# Patient Record
Sex: Male | Born: 1975 | Race: Asian | Hispanic: No | Marital: Single | Smoking: Current every day smoker
Health system: Southern US, Community
[De-identification: ages and names within clinical notes are randomized; demographics above are authoritative.]

## PROBLEM LIST (undated history)

## (undated) DIAGNOSIS — K5909 Other constipation: Secondary | ICD-10-CM

## (undated) DIAGNOSIS — Z8614 Personal history of Methicillin resistant Staphylococcus aureus infection: Secondary | ICD-10-CM

## (undated) DIAGNOSIS — M5412 Radiculopathy, cervical region: Secondary | ICD-10-CM

## (undated) DIAGNOSIS — K219 Gastro-esophageal reflux disease without esophagitis: Secondary | ICD-10-CM

## (undated) DIAGNOSIS — R569 Unspecified convulsions: Secondary | ICD-10-CM

## (undated) DIAGNOSIS — Z8619 Personal history of other infectious and parasitic diseases: Secondary | ICD-10-CM

## (undated) DIAGNOSIS — Z8719 Personal history of other diseases of the digestive system: Secondary | ICD-10-CM

## (undated) DIAGNOSIS — I10 Essential (primary) hypertension: Secondary | ICD-10-CM

## (undated) DIAGNOSIS — Z87898 Personal history of other specified conditions: Secondary | ICD-10-CM

## (undated) DIAGNOSIS — K611 Rectal abscess: Secondary | ICD-10-CM

## (undated) DIAGNOSIS — I4510 Unspecified right bundle-branch block: Secondary | ICD-10-CM

## (undated) DIAGNOSIS — K629 Disease of anus and rectum, unspecified: Secondary | ICD-10-CM

## (undated) HISTORY — PX: COLONOSCOPY WITH PROPOFOL: SHX5780

## (undated) HISTORY — PX: COLONOSCOPY: SHX174

---
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
## 2006-11-14 ENCOUNTER — Emergency Department (HOSPITAL_COMMUNITY): Admission: EM | Admit: 2006-11-14 | Discharge: 2006-11-14 | Payer: Self-pay | Admitting: Emergency Medicine

## 2007-04-13 DIAGNOSIS — Z87828 Personal history of other (healed) physical injury and trauma: Secondary | ICD-10-CM

## 2007-04-13 HISTORY — DX: Personal history of other (healed) physical injury and trauma: Z87.828

## 2007-04-29 ENCOUNTER — Inpatient Hospital Stay (HOSPITAL_COMMUNITY): Admission: AC | Admit: 2007-04-29 | Discharge: 2007-04-30 | Payer: Self-pay
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
## 2010-11-06 LAB — URINALYSIS, ROUTINE W REFLEX MICROSCOPIC
Bilirubin Urine: NEGATIVE
Glucose, UA: 100 — AB
Hgb urine dipstick: NEGATIVE
Ketones, ur: NEGATIVE
Protein, ur: >300 — AB

## 2010-11-06 LAB — I-STAT 8, (EC8 V) (CONVERTED LAB)
Acid-base deficit: 2
HCT: 52
Hemoglobin: 17.7 — ABNORMAL HIGH
Potassium: 4.2
Sodium: 137
TCO2: 25

## 2010-11-06 LAB — CBC
HCT: 45.3
MCV: 84.3
Platelets: 409 — ABNORMAL HIGH
WBC: 16.6 — ABNORMAL HIGH

## 2010-11-06 LAB — TYPE AND SCREEN: Antibody Screen: NEGATIVE

## 2010-11-06 LAB — RAPID URINE DRUG SCREEN, HOSP PERFORMED
Barbiturates: NOT DETECTED
Benzodiazepines: NOT DETECTED
Cocaine: NOT DETECTED

## 2010-11-06 LAB — URINE MICROSCOPIC-ADD ON

## 2010-11-06 LAB — ABO/RH: ABO/RH(D): A POS

## 2014-02-12 DIAGNOSIS — Z8614 Personal history of Methicillin resistant Staphylococcus aureus infection: Secondary | ICD-10-CM

## 2014-02-12 HISTORY — DX: Personal history of Methicillin resistant Staphylococcus aureus infection: Z86.14

## 2015-06-11 ENCOUNTER — Encounter (HOSPITAL_COMMUNITY): Payer: Self-pay | Admitting: Emergency Medicine

## 2015-06-11 ENCOUNTER — Emergency Department (HOSPITAL_COMMUNITY)
Admission: EM | Admit: 2015-06-11 | Discharge: 2015-06-11 | Disposition: A | Discharge status: Home / Self Care | Payer: Self-pay | Attending: Emergency Medicine | Admitting: Emergency Medicine

## 2015-06-11 ENCOUNTER — Emergency Department (HOSPITAL_COMMUNITY): Payer: Self-pay

## 2015-06-11 DIAGNOSIS — F17200 Nicotine dependence, unspecified, uncomplicated: Secondary | ICD-10-CM | POA: Insufficient documentation

## 2015-06-11 DIAGNOSIS — Y998 Other external cause status: Secondary | ICD-10-CM | POA: Insufficient documentation

## 2015-06-11 DIAGNOSIS — Z880 Allergy status to penicillin: Secondary | ICD-10-CM | POA: Insufficient documentation

## 2015-06-11 DIAGNOSIS — R569 Unspecified convulsions: Secondary | ICD-10-CM | POA: Insufficient documentation

## 2015-06-11 DIAGNOSIS — F1210 Cannabis abuse, uncomplicated: Secondary | ICD-10-CM | POA: Insufficient documentation

## 2015-06-11 DIAGNOSIS — I10 Essential (primary) hypertension: Secondary | ICD-10-CM | POA: Insufficient documentation

## 2015-06-11 DIAGNOSIS — Y92410 Unspecified street and highway as the place of occurrence of the external cause: Secondary | ICD-10-CM | POA: Insufficient documentation

## 2015-06-11 DIAGNOSIS — Y9389 Activity, other specified: Secondary | ICD-10-CM | POA: Insufficient documentation

## 2015-06-11 DIAGNOSIS — R42 Dizziness and giddiness: Secondary | ICD-10-CM | POA: Insufficient documentation

## 2015-06-11 DIAGNOSIS — F1310 Sedative, hypnotic or anxiolytic abuse, uncomplicated: Secondary | ICD-10-CM | POA: Insufficient documentation

## 2015-06-11 DIAGNOSIS — S60511A Abrasion of right hand, initial encounter: Secondary | ICD-10-CM | POA: Insufficient documentation

## 2015-06-11 DIAGNOSIS — S60512A Abrasion of left hand, initial encounter: Secondary | ICD-10-CM | POA: Insufficient documentation

## 2015-06-11 HISTORY — DX: Essential (primary) hypertension: I10

## 2015-06-11 HISTORY — DX: Unspecified convulsions: R56.9

## 2015-06-11 LAB — CBG MONITORING, ED: GLUCOSE-CAPILLARY: 122 mg/dL — AB (ref 65–99)

## 2015-06-11 LAB — CBC
HEMATOCRIT: 46.9 % (ref 39.0–52.0)
HEMOGLOBIN: 15.5 g/dL (ref 13.0–17.0)
MCH: 27.7 pg (ref 26.0–34.0)
MCHC: 33 g/dL (ref 30.0–36.0)
MCV: 83.9 fL (ref 78.0–100.0)
PLATELETS: 411 10*3/uL — AB (ref 150–400)
RBC: 5.59 MIL/uL (ref 4.22–5.81)
RDW: 15.3 % (ref 11.5–15.5)
WBC: 15.2 10*3/uL — AB (ref 4.0–10.5)

## 2015-06-11 LAB — RAPID URINE DRUG SCREEN, HOSP PERFORMED
AMPHETAMINES: POSITIVE — AB
BARBITURATES: NOT DETECTED
BENZODIAZEPINES: POSITIVE — AB
Cocaine: NOT DETECTED
Opiates: NOT DETECTED
TETRAHYDROCANNABINOL: POSITIVE — AB

## 2015-06-11 LAB — BASIC METABOLIC PANEL
ANION GAP: 12 (ref 5–15)
BUN: 13 mg/dL (ref 6–20)
CHLORIDE: 107 mmol/L (ref 101–111)
CO2: 20 mmol/L — AB (ref 22–32)
Calcium: 9.6 mg/dL (ref 8.9–10.3)
Creatinine, Ser: 0.88 mg/dL (ref 0.61–1.24)
GFR calc Af Amer: >60 mL/min (ref >60)
GLUCOSE: 112 mg/dL — AB (ref 65–99)
POTASSIUM: 4 mmol/L (ref 3.5–5.1)
Sodium: 139 mmol/L (ref 135–145)

## 2015-06-11 LAB — CK: CK TOTAL: 133 U/L (ref 49–397)

## 2015-06-11 MED ORDER — SODIUM CHLORIDE 0.9 % IV BOLUS (SEPSIS)
1000.0000 mL | Freq: Once | INTRAVENOUS | Status: AC
  Administered 2015-06-11: 1000 mL via INTRAVENOUS

## 2015-06-11 MED ORDER — LEVETIRACETAM 500 MG PO TABS: 500.0000 mg | ORAL_TABLET | Freq: Two times a day (BID) | ORAL | Status: DC
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
## 2015-06-11 NOTE — Discharge Instructions (Signed)
I recommend starting Keppra twice daily for seizures.  You will receive a phone call to establish your outpatient Neurology follow up.  Do not drive or operate heavy machinery until you have been cleared by neurology.  Seizure, Adult A seizure is abnormal electrical activity in the brain. Seizures usually last from 30 seconds to 2 minutes. There are various types of seizures. Before a seizure, you may have a warning sensation (aura) that a seizure is about to occur. An aura may include the following symptoms:   Fear or anxiety.  Nausea.  Feeling like the room is spinning (vertigo).  Vision changes, such as seeing flashing lights or spots. Common symptoms during a seizure include:  A change in attention or behavior (altered mental status).  Convulsions with rhythmic jerking movements.  Drooling.  Rapid eye movements.  Grunting.  Loss of bladder and bowel control.  Bitter taste in the mouth.  Tongue biting. After a seizure, you may feel confused and sleepy. You may also have an injury resulting from convulsions during the seizure. HOME CARE INSTRUCTIONS   If you are given medicines, take them exactly as prescribed by your health care provider.  Keep all follow-up appointments as directed by your health care provider.  Do not swim or drive or engage in risky activity during which a seizure could cause further injury to you or others until your health care provider says it is OK.  Get adequate rest.  Teach friends and family what to do if you have a seizure. They should:  Lay you on the ground to prevent a fall.  Put a cushion under your head.  Loosen any tight clothing around your neck.  Turn you on your side. If vomiting occurs, this helps keep your airway clear.  Stay with you until you recover.  Know whether or not you need emergency care. SEEK IMMEDIATE MEDICAL CARE IF:  The seizure lasts longer than 5 minutes.  The seizure is severe or you do not wake up  immediately after the seizure.  You have an altered mental status after the seizure.  You are having more frequent or worsening seizures. Someone should drive you to the emergency department or call local emergency services (911 in [place] MAKE SURE YOU:  Understand these instructions.  Will watch your condition.  Will get help right away if you are not doing well or get worse.   This information is not intended to replace advice given to you by your health care provider. Make sure you discuss any questions you have with your health care provider.   Document Released: 01/27/2000 Document Revised: 02/19/2014 Document Reviewed: 09/10/2012 Elsevier Interactive Patient Education [employer]2016 [employer] Inc[employer]
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
## 2016-07-13 DIAGNOSIS — Z87448 Personal history of other diseases of urinary system: Secondary | ICD-10-CM

## 2016-07-13 HISTORY — DX: Personal history of other diseases of urinary system: Z87.448

## 2016-07-30 ENCOUNTER — Emergency Department (HOSPITAL_COMMUNITY): Payer: Medicaid Other

## 2016-07-30 ENCOUNTER — Encounter (HOSPITAL_COMMUNITY): Payer: Self-pay | Admitting: Emergency Medicine

## 2016-07-30 ENCOUNTER — Inpatient Hospital Stay (HOSPITAL_COMMUNITY)
Admission: EM | Admit: 2016-07-30 | Discharge: 2016-08-04 | DRG: 689 | Disposition: A | Discharge status: Home / Self Care | Payer: Medicaid Other | Attending: Internal Medicine | Admitting: Internal Medicine

## 2016-07-30 DIAGNOSIS — Z8249 Family history of ischemic heart disease and other diseases of the circulatory system: Secondary | ICD-10-CM | POA: Diagnosis not present

## 2016-07-30 DIAGNOSIS — Z809 Family history of malignant neoplasm, unspecified: Secondary | ICD-10-CM

## 2016-07-30 DIAGNOSIS — K6819 Other retroperitoneal abscess: Secondary | ICD-10-CM | POA: Diagnosis present

## 2016-07-30 DIAGNOSIS — G40909 Epilepsy, unspecified, not intractable, without status epilepticus: Secondary | ICD-10-CM | POA: Diagnosis present

## 2016-07-30 DIAGNOSIS — Z880 Allergy status to penicillin: Secondary | ICD-10-CM

## 2016-07-30 DIAGNOSIS — Z825 Family history of asthma and other chronic lower respiratory diseases: Secondary | ICD-10-CM | POA: Diagnosis not present

## 2016-07-30 DIAGNOSIS — K5909 Other constipation: Secondary | ICD-10-CM | POA: Diagnosis present

## 2016-07-30 DIAGNOSIS — N179 Acute kidney failure, unspecified: Secondary | ICD-10-CM | POA: Diagnosis present

## 2016-07-30 DIAGNOSIS — R059 Cough, unspecified: Secondary | ICD-10-CM

## 2016-07-30 DIAGNOSIS — R109 Unspecified abdominal pain: Secondary | ICD-10-CM

## 2016-07-30 DIAGNOSIS — R310 Gross hematuria: Secondary | ICD-10-CM

## 2016-07-30 DIAGNOSIS — R569 Unspecified convulsions: Secondary | ICD-10-CM

## 2016-07-30 DIAGNOSIS — I10 Essential (primary) hypertension: Secondary | ICD-10-CM | POA: Diagnosis present

## 2016-07-30 DIAGNOSIS — T39395A Adverse effect of other nonsteroidal anti-inflammatory drugs [NSAID], initial encounter: Secondary | ICD-10-CM | POA: Diagnosis present

## 2016-07-30 DIAGNOSIS — R1031 Right lower quadrant pain: Secondary | ICD-10-CM | POA: Diagnosis not present

## 2016-07-30 DIAGNOSIS — N10 Acute tubulo-interstitial nephritis: Principal | ICD-10-CM | POA: Diagnosis present

## 2016-07-30 DIAGNOSIS — R319 Hematuria, unspecified: Secondary | ICD-10-CM

## 2016-07-30 DIAGNOSIS — K5900 Constipation, unspecified: Secondary | ICD-10-CM

## 2016-07-30 DIAGNOSIS — R05 Cough: Secondary | ICD-10-CM

## 2016-07-30 LAB — COMPREHENSIVE METABOLIC PANEL
ALK PHOS: 63 U/L (ref 38–126)
ALT: 27 U/L (ref 17–63)
ANION GAP: 13 (ref 5–15)
AST: 20 U/L (ref 15–41)
Albumin: 5 g/dL (ref 3.5–5.0)
BUN: 13 mg/dL (ref 6–20)
CALCIUM: 9.8 mg/dL (ref 8.9–10.3)
CHLORIDE: 101 mmol/L (ref 101–111)
CO2: 22 mmol/L (ref 22–32)
Creatinine, Ser: 0.94 mg/dL (ref 0.61–1.24)
GFR calc non Af Amer: >60 mL/min (ref >60)
Glucose, Bld: 99 mg/dL (ref 65–99)
Potassium: 3.9 mmol/L (ref 3.5–5.1)
SODIUM: 136 mmol/L (ref 135–145)
Total Bilirubin: 1.1 mg/dL (ref 0.3–1.2)
Total Protein: 8.4 g/dL — ABNORMAL HIGH (ref 6.5–8.1)

## 2016-07-30 LAB — CBC WITH DIFFERENTIAL/PLATELET
Basophils Absolute: 0 10*3/uL (ref 0.0–0.1)
Basophils Relative: 0 %
EOS PCT: 1 %
Eosinophils Absolute: 0.2 10*3/uL (ref 0.0–0.7)
HCT: 44.5 % (ref 39.0–52.0)
Hemoglobin: 15.6 g/dL (ref 13.0–17.0)
Lymphocytes Relative: 15 %
Lymphs Abs: 2.8 10*3/uL (ref 0.7–4.0)
MCH: 27.7 pg (ref 26.0–34.0)
MCHC: 35.1 g/dL (ref 30.0–36.0)
MCV: 79 fL (ref 78.0–100.0)
Monocytes Absolute: 2 10*3/uL — ABNORMAL HIGH (ref 0.1–1.0)
Monocytes Relative: 11 %
NEUTROS PCT: 73 %
Neutro Abs: 13.6 10*3/uL — ABNORMAL HIGH (ref 1.7–7.7)
Platelets: 402 10*3/uL — ABNORMAL HIGH (ref 150–400)
RBC: 5.63 MIL/uL (ref 4.22–5.81)
RDW: 15.1 % (ref 11.5–15.5)
WBC: 18.6 10*3/uL — AB (ref 4.0–10.5)

## 2016-07-30 LAB — URINALYSIS, ROUTINE W REFLEX MICROSCOPIC
Squamous Epithelial / LPF: NONE SEEN
WBC UA: NONE SEEN WBC/hpf (ref 0–5)

## 2016-07-30 LAB — LIPASE, BLOOD: Lipase: 37 U/L (ref 11–51)

## 2016-07-30 LAB — I-STAT CG4 LACTIC ACID, ED: LACTIC ACID, VENOUS: 0.97 mmol/L (ref 0.5–1.9)

## 2016-07-30 MED ORDER — LEVOFLOXACIN IN D5W 750 MG/150ML IV SOLN
750.0000 mg | Freq: Once | INTRAVENOUS | Status: DC
  Administered 2016-07-30: 750 mg via INTRAVENOUS
  Filled 2016-07-30: qty 150

## 2016-07-30 MED ORDER — ONDANSETRON HCL 4 MG PO TABS: 4.0000 mg | ORAL_TABLET | Freq: Four times a day (QID) | ORAL | Status: DC | PRN

## 2016-07-30 MED ORDER — SODIUM CHLORIDE 0.9 % IV SOLN
INTRAVENOUS | Status: DC
  Administered 2016-07-30 – 2016-07-31 (×3): via INTRAVENOUS
  Administered 2016-08-01: 100 mL/h via INTRAVENOUS
  Administered 2016-08-01 – 2016-08-02 (×2): via INTRAVENOUS
  Administered 2016-08-02: 1000 mL via INTRAVENOUS
  Administered 2016-08-02: 100 mL/h via INTRAVENOUS

## 2016-07-30 MED ORDER — DEXTROSE 5 % IV SOLN
1.0000 g | Freq: Once | INTRAVENOUS | Status: AC
  Administered 2016-07-30: 1 g via INTRAVENOUS
  Filled 2016-07-30: qty 10

## 2016-07-30 MED ORDER — LEVOFLOXACIN IN D5W 750 MG/150ML IV SOLN: 750.0000 mg | Freq: Once | INTRAVENOUS | Status: DC

## 2016-07-30 MED ORDER — ONDANSETRON HCL 4 MG/2ML IJ SOLN: 4.0000 mg | Freq: Four times a day (QID) | INTRAMUSCULAR | Status: DC | PRN

## 2016-07-30 MED ORDER — ACETAMINOPHEN 325 MG PO TABS
650.0000 mg | ORAL_TABLET | Freq: Four times a day (QID) | ORAL | Status: DC | PRN
Start: 2016-07-30 — End: 2016-08-04
  Administered 2016-07-31 – 2016-08-03 (×6): 650 mg via ORAL
  Filled 2016-07-30 (×6): qty 2

## 2016-07-30 MED ORDER — MORPHINE SULFATE (PF) 4 MG/ML IV SOLN
4.0000 mg | Freq: Once | INTRAVENOUS | Status: AC
  Administered 2016-07-30: 4 mg via INTRAVENOUS
  Filled 2016-07-30: qty 1

## 2016-07-30 MED ORDER — SODIUM CHLORIDE 0.9 % IV BOLUS (SEPSIS)
1000.0000 mL | Freq: Once | INTRAVENOUS | Status: AC
  Administered 2016-07-30: 1000 mL via INTRAVENOUS

## 2016-07-30 MED ORDER — DEXTROSE 5 % IV SOLN: 2.0000 g | Freq: Once | INTRAVENOUS | Status: DC

## 2016-07-30 MED ORDER — MORPHINE SULFATE (PF) 2 MG/ML IV SOLN
2.0000 mg | Freq: Once | INTRAVENOUS | Status: AC
  Administered 2016-07-30: 2 mg via INTRAVENOUS
  Filled 2016-07-30: qty 1

## 2016-07-30 MED ORDER — CEFEPIME HCL 1 G IJ SOLR
1.0000 g | Freq: Two times a day (BID) | INTRAMUSCULAR | Status: DC
  Administered 2016-07-30 – 2016-08-03 (×9): 1 g via INTRAVENOUS
  Filled 2016-07-30 (×12): qty 1

## 2016-07-30 MED ORDER — IOPAMIDOL (ISOVUE-300) INJECTION 61%
INTRAVENOUS | Status: AC
  Filled 2016-07-30: qty 100

## 2016-07-30 MED ORDER — BISACODYL 5 MG PO TBEC
5.0000 mg | DELAYED_RELEASE_TABLET | Freq: Two times a day (BID) | ORAL | Status: DC | PRN
  Administered 2016-07-30 – 2016-07-31 (×2): 5 mg via ORAL
  Filled 2016-07-30 (×2): qty 1

## 2016-07-30 MED ORDER — IOPAMIDOL (ISOVUE-300) INJECTION 61%
100.0000 mL | Freq: Once | INTRAVENOUS | Status: AC | PRN
  Administered 2016-07-30: 100 mL via INTRAVENOUS

## 2016-07-30 MED ORDER — IBUPROFEN 200 MG PO TABS
400.0000 mg | ORAL_TABLET | Freq: Four times a day (QID) | ORAL | Status: DC | PRN
  Administered 2016-07-30: 400 mg via ORAL
  Filled 2016-07-30: qty 2

## 2016-07-30 MED ORDER — ACETAMINOPHEN 650 MG RE SUPP: 650.0000 mg | Freq: Four times a day (QID) | RECTAL | Status: DC | PRN

## 2016-07-30 NOTE — ED Notes (Signed)
Unable to collect labs at this time MD is in the room talking with patient 

## 2016-07-30 NOTE — ED Triage Notes (Signed)
Pt from home with complaints of right sided flank pain, hematuria, and lower abdominal pain with bloating. Pt states the bloating and abdominal pain have been ongoing x 2 days had he has been taking magnesium at home and dulcolax for constipation at baseline and this has not been helping with his bloating. Pt states that yesterday he began having blood in his urine so [facility] where [facility] stated "their machines were unable to detect bacteria due to the amount of blood in the urine" Pt states his symptoms and pain have gotten worse so he has come here

## 2016-07-30 NOTE — Progress Notes (Signed)
Pharmacy Antibiotic Note  Brian RocheMohammed Gilbert is a 41 y.o. male admitted on 07/30/2016 with UTI.  Complaints of flank pain, hematuria and lower abdominal pain.  Patient received Ceftriaxone 1gm IV x 1 in ED @ 20:45.  Aztreonam 2gm and Levofloxacin 750mg  IV x 1 dose each ordered in ED.  Pharmacy has been consulted for Levofloxacin and Aztreonam dosing.  CT Abdomen/Pelvis concerning for infectious pyelitis; suspicious for extensive infectious phlegmon secondary to bilateral ureteritis.    Plan:  Levofloxacin 750mg  IV q24h  Aztreonam 1gm IV q8h  Follow cultures  Height: 5\' 11"  (180.3 cm) Weight: 240 lb (108.9 kg) IBW/kg (Calculated) : 75.3  Temp (24hrs), Avg:98.5 F (36.9 C), Min:98.3 F (36.8 C), Max:98.7 F (37.1 C)   Recent Labs Lab 07/30/16 1703 07/30/16 2034  WBC 18.6*  --   CREATININE 0.94  --   LATICACIDVEN  --  0.97    Estimated Creatinine Clearance: 133.7 mL/min (by C-G formula based on SCr of 0.94 mg/dL).    Allergies  Allergen Reactions  . Penicillins Anaphylaxis    Has patient had a PCN reaction causing immediate rash, facial/tongue/throat swelling, SOB or lightheadedness with hypotension: yes Has patient had a PCN reaction causing severe rash involving mucus membranes or skin necrosis: no Has patient had a PCN reaction that required hospitalization: no Has patient had a PCN reaction occurring within the last 10 years? no If all of the above answers are "NO", then may proceed with Cephalosporin use.    Antimicrobials this admission: 6/18 Ceftriaxone x 1  6/18 Aztreonam >>   6/18 Levofloxacin >>  Dose adjustments this admission:    Microbiology results: 6/18 BCx: sent 6/18 UCx: sent   Thank you for allowing pharmacy to be a part of this patient's care.  Maryellen PilePoindexter, [place]arien Kading Trefz, PharmD 07/30/2016 10:02 PM

## 2016-07-30 NOTE — ED Provider Notes (Signed)
Emergency Department Provider Note   I have reviewed the triage vital signs and the nursing notes.   HISTORY  Chief Complaint Abdominal Pain and Flank Pain (right)   HPI Brian Gilbert is a 41 y.o. male with PMH of HTN and seizure disorder presents to the emergency department for evaluation of hematuria with worsening lower and slightly right-sided abdominal pain. Symptoms began over the past 2-3 days and gradually worsened over that time. No radiation of pain. No similar pain in the past. He has been treating as if pain is related to constipation at home with stool softener and magnesium. He's had small number of bowel movements but reports an associated bloating and decreased appetite because of fullness. He went to urgent care when his urine began to be much darker. They were unable to interpret the results because of the color of the urine. He denies any fevers or chills. No chest pain. Pain made worse with deep breathing but localized in the lower abdomen.   Past Medical History:  Diagnosis Date  . Hypertension   . Seizures [facility])     Patient Active Problem List   Diagnosis Date Noted  . Acute pyelonephritis 07/30/2016  . Seizures (HCC) 07/30/2016  . Essential hypertension 07/30/2016    History reviewed. No pertinent surgical history.    Allergies Penicillins  Family History  Problem Relation Age of Onset  . COPD Maternal Grandmother   . Hypertension Maternal Grandmother     Social History Social History  Substance Use Topics  . Smoking status: Current Every Day Smoker  . Smokeless tobacco: Never Used  . Alcohol use No    Review of Systems  Constitutional: No fever/chills Eyes: No visual changes. ENT: No sore throat. Cardiovascular: Denies chest pain. Respiratory: Denies shortness of breath. Gastrointestinal: Positive RLQ abdominal pain.  No nausea, no vomiting.  No diarrhea.  No constipation. Genitourinary: Negative for dysuria. Positive dark urine.   Musculoskeletal: Negative for back pain. Skin: Negative for rash. Neurological: Negative for headaches, focal weakness or numbness.  10-point ROS otherwise negative.  ____________________________________________   PHYSICAL EXAM:  VITAL SIGNS: ED Triage Vitals  Enc Vitals Group     BP 07/30/16 1543 (!) 164/94     Pulse Rate 07/30/16 1543 75     Resp 07/30/16 1543 20     Temp 07/30/16 1543 98.3 F (36.8 C)     Temp Source 07/30/16 1543 Oral     SpO2 07/30/16 1543 97 %     Weight 07/30/16 1544 240 lb (108.9 kg)     Height 07/30/16 1544 5\' 11"  (1.803 m)     Pain Score 07/30/16 1543 10   Constitutional: Alert and oriented. Slightly diaphoretic and uncomfortable appearing but sitting in bedside chair.  Eyes: Conjunctivae are normal. Head: Atraumatic. Nose: No congestion/rhinnorhea. Mouth/Throat: Mucous membranes are moist.  Oropharynx non-erythematous. Neck: No stridor.  Cardiovascular: Normal rate, regular rhythm. Good peripheral circulation. Grossly normal heart sounds.   Respiratory: Normal respiratory effort.  No retractions. Lungs CTAB. Gastrointestinal: Soft with positive lower abdominal tenderness. No rebound or guarding.  No distention.  Musculoskeletal: No lower extremity tenderness nor edema. No gross deformities of extremities. Neurologic:  Normal speech and language. No gross focal neurologic deficits are appreciated.  Skin:  Skin is warm, dry and intact. No rash noted.  ____________________________________________   LABS (all labs ordered are listed, but only abnormal results are displayed)  Labs Reviewed  URINALYSIS, ROUTINE W REFLEX MICROSCOPIC - Abnormal; Notable for the following:  Result Value   Color, Urine RED (*)    APPearance TURBID (*)    Glucose, UA   (*)    Value: TEST NOT REPORTED DUE TO COLOR INTERFERENCE OF URINE PIGMENT   Hgb urine dipstick   (*)    Value: TEST NOT REPORTED DUE TO COLOR INTERFERENCE OF URINE PIGMENT   Bilirubin Urine    (*)    Value: TEST NOT REPORTED DUE TO COLOR INTERFERENCE OF URINE PIGMENT   Ketones, ur   (*)    Value: TEST NOT REPORTED DUE TO COLOR INTERFERENCE OF URINE PIGMENT   Protein, ur   (*)    Value: TEST NOT REPORTED DUE TO COLOR INTERFERENCE OF URINE PIGMENT   Nitrite   (*)    Value: TEST NOT REPORTED DUE TO COLOR INTERFERENCE OF URINE PIGMENT   Leukocytes, UA   (*)    Value: TEST NOT REPORTED DUE TO COLOR INTERFERENCE OF URINE PIGMENT   Bacteria, UA MANY (*)    All other components within normal limits  COMPREHENSIVE METABOLIC PANEL - Abnormal; Notable for the following:    Total Protein 8.4 (*)    All other components within normal limits  CBC WITH DIFFERENTIAL/PLATELET - Abnormal; Notable for the following:    WBC 18.6 (*)    Platelets 402 (*)    Neutro Abs 13.6 (*)    Monocytes Absolute 2.0 (*)    All other components within normal limits  CBC - Abnormal; Notable for the following:    WBC 13.9 (*)    HCT 38.4 (*)    All other components within normal limits  BASIC METABOLIC PANEL - Abnormal; Notable for the following:    Glucose, Bld 103 (*)    Calcium 8.7 (*)    All other components within normal limits  CULTURE, BLOOD (ROUTINE X 2)  CULTURE, BLOOD (ROUTINE X 2)  URINE CULTURE  MRSA PCR SCREENING  LIPASE, BLOOD  HIV ANTIBODY (ROUTINE TESTING)  I-STAT CG4 LACTIC ACID, ED   ____________________________________________  RADIOLOGY  Ct Abdomen Pelvis W Contrast  Result Date: 07/30/2016 CLINICAL DATA:  Acute onset of right flank pain and hematuria. Lower abdominal pain and bloating. Initial encounter. EXAM: CT ABDOMEN AND PELVIS WITH CONTRAST TECHNIQUE: Multidetector CT imaging of the abdomen and pelvis was performed using the standard protocol following bolus administration of intravenous contrast. CONTRAST:  ISOVUE-300 IOPAMIDOL (ISOVUE-300) INJECTION 61% COMPARISON:  None. FINDINGS: Lower chest: The visualized lung bases are grossly clear. The visualized portions of  the mediastinum are unremarkable. Hepatobiliary: The liver is unremarkable in appearance. The gallbladder is unremarkable in appearance. The common bile duct remains normal in caliber. Pancreas: The pancreas is within normal limits. Spleen: The spleen is unremarkable in appearance. Adrenals/Urinary Tract: The adrenal glands are unremarkable in appearance. There is wall enhancement at the renal pelves bilaterally, concerning for infectious pyelitis. Diffuse soft tissue density is seen tracking along both ureters, worse on the right. This is suspicious for extensive infectious phlegmon secondary to bilateral ureteritis. Given the patient's gross hematuria, underlying hemorrhage secondary to ureteritis cannot be excluded. There is no evidence of hydronephrosis. The kidneys are otherwise grossly unremarkable. Mild bilateral perinephric fluid is noted. No renal or ureteral stones are identified. Stomach/Bowel: The stomach is unremarkable in appearance. The small bowel is within normal limits. The appendix is normal in caliber, without evidence of appendicitis. The colon is unremarkable in appearance. Vascular/Lymphatic: Minimal calcification is noted at the distal abdominal aorta. No retroperitoneal or pelvic sidewall lymphadenopathy  is seen. Reproductive: The bladder is mildly distended grossly remarkable. The prostate remains normal in size. Other: No additional soft tissue abnormalities are seen. Musculoskeletal: No acute osseous abnormalities are identified. The visualized musculature is unremarkable in appearance. IMPRESSION: Wall enhancement at the renal pelves bilaterally, concerning for infectious pyelitis. Diffuse soft tissue density tracking along both ureters, worse on the right. This is suspicious for extensive infectious phlegmon secondary to bilateral ureteritis. Given the patient's gross hematuria, underlying hemorrhage secondary to ureteritis cannot be excluded. Electronically Signed   By: Roanna Raider  M.D.   On: 07/30/2016 19:48    ____________________________________________   PROCEDURES  Procedure(s) performed:   Procedures  None ____________________________________________   INITIAL IMPRESSION / ASSESSMENT AND PLAN / ED COURSE  Pertinent labs & imaging results that were available during my care of the patient were reviewed by me and considered in my medical decision making (see chart for details).  Patient presents to the emergency department for evaluation of lower abdominal pain with some bloating and dark urine. UA pending. Sending labs. Patient with some focal right lower quadrant tenderness to palpation. Plan for CT scan.  Differential diagnosis includes but is not exclusive to acute appendicitis, renal colic, testicular torsion, urinary tract infection, prostatitis,  diverticulitis, small bowel obstruction, colitis, abdominal aortic aneurysm, gastroenteritis, constipation etc.  09:32 PM Spoke with Dr. Annabell Howells with Urology regarding the CT images. He recommends observation overnight with broad-spectrum antibiotics and wait for cultures. Some suspicion that this phlegmon may be developing abscess and could require some intervention. Patient's pain is better controlled with morphine. I discussed the CT findings and lab work with him in detail. The patient is agreeable to stay in the hospital for treatment. Notes a history or Penicillin allergy but has take Amoxicillin in the past without difficulty. Initially given Rocephin but broadened after discussion with Dr. Annabell Howells.   Discussed patient's case with Hospitalist, Dr. Maryfrances Bunnell. Patient and family (if present) updated with plan. Care transferred to Hospitalist service.  I reviewed all nursing notes, vitals, pertinent old records, EKGs, labs, imaging (as available).  ____________________________________________  FINAL CLINICAL IMPRESSION(S) / ED DIAGNOSES  Final diagnoses:  Acute pyelitis  Right flank pain  Gross hematuria       MEDICATIONS GIVEN DURING THIS VISIT:  Medications  iopamidol (ISOVUE-300) 61 % injection (not administered)  0.9 %  sodium chloride infusion ( Intravenous New Bag/Given 07/31/16 0547)  acetaminophen (TYLENOL) tablet 650 mg (not administered)    Or  acetaminophen (TYLENOL) suppository 650 mg (not administered)  ibuprofen (ADVIL,MOTRIN) tablet 400 mg (400 mg Oral Given 07/30/16 2314)  bisacodyl (DULCOLAX) EC tablet 5 mg (5 mg Oral Given 07/30/16 2314)  ondansetron (ZOFRAN) tablet 4 mg (not administered)    Or  ondansetron (ZOFRAN) injection 4 mg (not administered)  ceFEPIme (MAXIPIME) 1 g in dextrose 5 % 50 mL IVPB (0 g Intravenous Stopped 07/30/16 2345)  traMADol (ULTRAM) tablet 50 mg (50 mg Oral Given 07/31/16 0545)  morphine 2 MG/ML injection 2 mg (not administered)  sodium chloride 0.9 % bolus 1,000 mL (0 mLs Intravenous Stopped 07/30/16 1811)  morphine 4 MG/ML injection 4 mg (4 mg Intravenous Given 07/30/16 1706)  morphine 4 MG/ML injection 4 mg (4 mg Intravenous Given 07/30/16 1910)  iopamidol (ISOVUE-300) 61 % injection 100 mL (100 mLs Intravenous Contrast Given 07/30/16 1921)  sodium chloride 0.9 % bolus 1,000 mL (1,000 mLs Intravenous New Bag/Given 07/30/16 2041)  cefTRIAXone (ROCEPHIN) 1 g in dextrose 5 % 50 mL IVPB (0 g  Intravenous Stopped 07/30/16 2115)  morphine 4 MG/ML injection 4 mg (4 mg Intravenous Given 07/30/16 2052)  morphine 2 MG/ML injection 2 mg (2 mg Intravenous Given 07/30/16 2330)     NEW OUTPATIENT MEDICATIONS STARTED DURING THIS VISIT:  None   Note:  This document was prepared using Dragon voice recognition software and may include unintentional dictation errors.  Alona BeneJoshua Long, MD Emergency Medicine   Long, Arlyss RepressJoshua G, MD 07/31/16 [phone]

## 2016-07-31 LAB — RAPID URINE DRUG SCREEN, HOSP PERFORMED
AMPHETAMINES: NOT DETECTED
BARBITURATES: NOT DETECTED
BENZODIAZEPINES: NOT DETECTED
Cocaine: NOT DETECTED
Opiates: POSITIVE — AB
Tetrahydrocannabinol: NOT DETECTED

## 2016-07-31 LAB — CBC
HCT: 38.4 % — ABNORMAL LOW (ref 39.0–52.0)
Hemoglobin: 13 g/dL (ref 13.0–17.0)
MCH: 26.6 pg (ref 26.0–34.0)
MCHC: 33.9 g/dL (ref 30.0–36.0)
MCV: 78.7 fL (ref 78.0–100.0)
PLATELETS: 318 10*3/uL (ref 150–400)
RBC: 4.88 MIL/uL (ref 4.22–5.81)
RDW: 15.1 % (ref 11.5–15.5)
WBC: 13.9 10*3/uL — ABNORMAL HIGH (ref 4.0–10.5)

## 2016-07-31 LAB — BASIC METABOLIC PANEL
Anion gap: 7 (ref 5–15)
BUN: 16 mg/dL (ref 6–20)
CHLORIDE: 105 mmol/L (ref 101–111)
CO2: 25 mmol/L (ref 22–32)
Calcium: 8.7 mg/dL — ABNORMAL LOW (ref 8.9–10.3)
Creatinine, Ser: 1.08 mg/dL (ref 0.61–1.24)
GFR calc Af Amer: >60 mL/min (ref >60)
GLUCOSE: 103 mg/dL — AB (ref 65–99)
Potassium: 3.7 mmol/L (ref 3.5–5.1)
SODIUM: 137 mmol/L (ref 135–145)

## 2016-07-31 LAB — MRSA PCR SCREENING: MRSA by PCR: NEGATIVE

## 2016-07-31 LAB — HIV ANTIBODY (ROUTINE TESTING W REFLEX): HIV SCREEN 4TH GENERATION: NONREACTIVE

## 2016-07-31 MED ORDER — TRAMADOL HCL 50 MG PO TABS
50.0000 mg | ORAL_TABLET | Freq: Four times a day (QID) | ORAL | Status: DC | PRN
Start: 2016-07-31 — End: 2016-08-01
  Administered 2016-07-31: 50 mg via ORAL
  Filled 2016-07-31: qty 1

## 2016-07-31 MED ORDER — POLYETHYLENE GLYCOL 3350 17 G PO PACK
17.0000 g | PACK | Freq: Every day | ORAL | Status: DC
  Administered 2016-07-31 – 2016-08-01 (×2): 17 g via ORAL
  Filled 2016-07-31 (×2): qty 1

## 2016-07-31 MED ORDER — SENNOSIDES-DOCUSATE SODIUM 8.6-50 MG PO TABS
1.0000 | ORAL_TABLET | Freq: Two times a day (BID) | ORAL | Status: DC
  Administered 2016-07-31 – 2016-08-04 (×9): 1 via ORAL
  Filled 2016-07-31 (×10): qty 1

## 2016-07-31 MED ORDER — MORPHINE SULFATE (PF) 2 MG/ML IV SOLN
2.0000 mg | INTRAVENOUS | Status: DC | PRN
  Administered 2016-07-31 – 2016-08-04 (×18): 2 mg via INTRAVENOUS
  Filled 2016-07-31 (×19): qty 1

## 2016-07-31 NOTE — Progress Notes (Addendum)
PROGRESS NOTE  Brian Gilbert NFA:[id number] DOB: 15-Feb-1975 DOA: 07/30/2016 PCP: Patient, No Pcp Per  HPI/Recap of past 24 hours:  tmax 100,5 , c/o abdominal pain, gross hematuria, chronic constipation, no n/v,    Assessment/Plan: Principal Problem:   Acute pyelonephritis Active Problems:   Seizures (HCC)   Essential hypertension    Acute pyelonephritis/ureteritis:  --CT imaging shows particularly severe bilateral pyelonephritis with suspicious for extensive infectious phlegmon secondary to bilateral ureteritis   -urine culture pending, blood culture in process, HIV testing in process, will also test  Urine for gc/chlamydia  -IV cefepime/ivf, iv prn analgesics  -EDP consulted Urology Dr Annabell Howells, may need intervention if cdeveloping abscess   Gross hematuria: Likely from acute infection, urology consulted hgb stable, avoid pharmacologic DVT prophylaxis for now  H/o Seizures: Last was >3 years ago he claims (chart suggests it was 14 months ago).  Does not take an AED. keppra listed as home meds, he stated he was only on it for 1-2 months in 2015.  Hypertension:  Not on meds at home, from acute illness, pain?, monitor  Constipation: stool softener   H/o substance abuse: check uds  H/o mrsa : will screen for mrsa     DVT prophylaxis: SCDs , not on pharmacologic prophylaxis due to hematuria Code Status: FULL  Family Communication: patient and multiple family in room Disposition Plan: not medically ready for discharge yet, expect home once medically stable with urology clearance Consultants:  EDP called Urology Dr Annabell Howells  Procedures:  none  Antibiotics:  Rocephin x1 in the ED on 6/18  Cefepime from 6/18   Objective: BP 118/76 (BP Location: Left Arm)   Pulse 67   Temp 98.4 F (36.9 C) (Oral)   Resp 18   Ht 5\' 11"  (1.803 m)   Wt 108.9 kg (240 lb)   SpO2 100%   BMI 33.47 kg/m   Intake/Output Summary (Last 24 hours) at 07/31/16 0812 Last data  filed at 07/31/16 0458  Gross per 24 hour  Intake          1443.33 ml  Output              650 ml  Net           793.33 ml   Filed Weights   07/30/16 1544  Weight: 108.9 kg (240 lb)    Exam:   General:  NAD  Cardiovascular: RRR  Respiratory: CTABL  Abdomen: suprapubic tenderness, +CVA right >left, positive BS  Musculoskeletal: No Edema  Neuro: aaox3  Data Reviewed: Basic Metabolic Panel:  Recent Labs Lab 07/30/16 1703 07/31/16 0542  NA 136 137  K 3.9 3.7  CL 101 105  CO2 22 25  GLUCOSE 99 103*  BUN 13 16  CREATININE 0.94 1.08  CALCIUM 9.8 8.7*   Liver Function Tests:  Recent Labs Lab 07/30/16 1703  AST 20  ALT 27  ALKPHOS 63  BILITOT 1.1  PROT 8.4*  ALBUMIN 5.0    Recent Labs Lab 07/30/16 1703  LIPASE 37   No results for input(s): AMMONIA in the last 168 hours. CBC:  Recent Labs Lab 07/30/16 1703 07/31/16 0542  WBC 18.6* 13.9*  NEUTROABS 13.6*  --   HGB 15.6 13.0  HCT 44.5 38.4*  MCV 79.0 78.7  PLT 402* 318   Cardiac Enzymes:   No results for input(s): CKTOTAL, CKMB, CKMBINDEX, TROPONINI in the last 168 hours. BNP (last 3 results) No results for input(s): BNP in the last 8760 hours.  ProBNP (last 3 results) No results for input(s): PROBNP in the last 8760 hours.  CBG: No results for input(s): GLUCAP in the last 168 hours.  No results found for this or any previous visit (from the past 240 hour(s)).   Studies: Ct Abdomen Pelvis W Contrast  Result Date: 07/30/2016 CLINICAL DATA:  Acute onset of right flank pain and hematuria. Lower abdominal pain and bloating. Initial encounter. EXAM: CT ABDOMEN AND PELVIS WITH CONTRAST TECHNIQUE: Multidetector CT imaging of the abdomen and pelvis was performed using the standard protocol following bolus administration of intravenous contrast. CONTRAST:  100mL ISOVUE-300 IOPAMIDOL (ISOVUE-300) INJECTION 61% COMPARISON:  None. FINDINGS: Lower chest: The visualized lung bases are grossly clear. The  visualized portions of the mediastinum are unremarkable. Hepatobiliary: The liver is unremarkable in appearance. The gallbladder is unremarkable in appearance. The common bile duct remains normal in caliber. Pancreas: The pancreas is within normal limits. Spleen: The spleen is unremarkable in appearance. Adrenals/Urinary Tract: The adrenal glands are unremarkable in appearance. There is wall enhancement at the renal pelves bilaterally, concerning for infectious pyelitis. Diffuse soft tissue density is seen tracking along both ureters, worse on the right. This is suspicious for extensive infectious phlegmon secondary to bilateral ureteritis. Given the patient's gross hematuria, underlying hemorrhage secondary to ureteritis cannot be excluded. There is no evidence of hydronephrosis. The kidneys are otherwise grossly unremarkable. Mild bilateral perinephric fluid is noted. No renal or ureteral stones are identified. Stomach/Bowel: The stomach is unremarkable in appearance. The small bowel is within normal limits. The appendix is normal in caliber, without evidence of appendicitis. The colon is unremarkable in appearance. Vascular/Lymphatic: Minimal calcification is noted at the distal abdominal aorta. No retroperitoneal or pelvic sidewall lymphadenopathy is seen. Reproductive: The bladder is mildly distended grossly remarkable. The prostate remains normal in size. Other: No additional soft tissue abnormalities are seen. Musculoskeletal: No acute osseous abnormalities are identified. The visualized musculature is unremarkable in appearance. IMPRESSION: Wall enhancement at the renal pelves bilaterally, concerning for infectious pyelitis. Diffuse soft tissue density tracking along both ureters, worse on the right. This is suspicious for extensive infectious phlegmon secondary to bilateral ureteritis. Given the patient's gross hematuria, underlying hemorrhage secondary to ureteritis cannot be excluded. Electronically  Signed   By: Roanna RaiderJeffery  Chang M.D.   On: 07/30/2016 19:48    Scheduled Meds:  Continuous Infusions: . sodium chloride 100 mL/hr at 07/31/16 0547  . ceFEPime (MAXIPIME) IV Stopped (07/30/16 2345)     Time spent: 25mins  Julus Kelley MD, PhD  [facility] Pager [phone]. If 7PM-7AM, please contact night-coverage at [email or web address], password [email or web address] 07/31/2016, 8:12 AM  LOS: 1 day

## 2016-07-31 NOTE — Progress Notes (Signed)
Pt states he is in 9/10 sharp continuous pain of L lower abdomen and R lower back. States morphine did give relief last night. Refused ibuprofen, acetaminophen, or Tramadol; states he didn't receive relief from them last night. Asking dr for an alternative pain medication, morphine or something similar, to help relieve pain. Will page Dr. Roda ShuttersXu. Pt willing to try heat; states it helped last night. Heat packs and emotional support given. Paged Dr. Roda ShuttersXu and will await further instruction. Will continue to monitor pt.

## 2016-07-31 NOTE — H&P (Signed)
History and Physical  Patient Name: Brian Gilbert     WUJ:[id number]    DOB: 1975/12/17    DOA: 07/30/2016 PCP: Patient, No Pcp Per  Patient coming from: Home  Chief Complaint: Flank pain, gross hematuria      HPI: Brian Gilbert is a 41 y.o. male with a past medical history significant for seizures not on AED who presents with hematuria and flank pain.  The patient was in his usual state of health until the last few days he developed "bloating" abdominal pain that he attributed to his chronic constipation.  It did not improve with magnesium PO and he started to have right flank pain as well.  Then in the last 24 hours, he started to notice gross hematuria, at first like "grapefruit juice" then later "like blood orange juice".  In the meantime, he started to have chills, sweats and worsening flank pain that weren't getting better and so today he came to the ER.  ED course: -Afebrile, heart rate 75, respirations and pulse is normal, blood pressure 164/94 -Na 136, K 3.9, Cr 0.94, WBC 18.6K, Hgb 15.6 -Urinalysis was red and turbid -Lipase normal -Lactate 0.97 -CT abdomen and pelvis showed bilateral pyelitis and possible phlegmon -Case was discussed with Urology who recommended IV broad-spectrum antibiotics and admission and consultation with Urology -Levaquin and Azactam were given and TRH were asked to evaluate for UTI/pyelonephritis     ROS: Review of Systems  Constitutional: Positive for chills, diaphoresis, fever and malaise/fatigue.  Genitourinary: Positive for flank pain and hematuria.  All other systems reviewed and are negative.         Past Medical History:  Diagnosis Date  . Hypertension   . Seizures (HCC)     History reviewed. No pertinent surgical history.  Social History: Patient lives with his wife and son.  The patient walks unassisted.  Nonsmoker.  He is from [place], originally [place].  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     Allergies  Allergen Reactions  . Penicillins Anaphylaxis    Has patient had a PCN reaction causing immediate rash, facial/tongue/throat swelling, SOB or lightheadedness with hypotension: yes Has patient had a PCN reaction causing severe rash involving mucus membranes or skin necrosis: no Has patient had a PCN reaction that required hospitalization: no Has patient had a PCN reaction occurring within the last 10 years? no If all of the above answers are "NO", then may proceed with Cephalosporin use.    Family history: family history includes COPD in his maternal grandmother; Hypertension in his maternal grandmother.  Prior to Admission medications   Medication Sig Start Date End Date Taking? Authorizing Provider  magnesium citrate SOLN Take 1 Bottle by mouth once.   Yes [provider]  phenylephrine-shark liver oil-mineral oil-petrolatum (PREPARATION H) 0.25-3-14-71.9 % rectal ointment Place 1 application rectally 2 (two) times daily as needed for hemorrhoids.   Yes [provider]  levETIRAcetam (KEPPRA) 500 MG tablet Take 1 tablet (500 mg total) by mouth 2 (two) times daily. Patient not taking: Reported on 07/30/2016 06/11/15   Cheri Fowler, PA-C       Physical Exam: BP 118/76 (BP Location: Left Arm)   Pulse 67   Temp 98.4 F (36.9 C) (Oral)   Resp 18   Ht 5\' 11"  (1.803 m)   Wt 108.9 kg (240 lb)   SpO2 100%   BMI 33.47 kg/m  General appearance: Well-developed, adult male, alert and in mild  distress from [facility].   Eyes: Anicteric, conjunctiva pink, lids and lashes normal. PERRL.    ENT: No nasal deformity, discharge, epistaxis.  Hearing normal. OP tacky dry without lesions.   Neck: No neck masses.  Trachea midline.  No thyromegaly/tenderness. Lymph: No cervical or supraclavicular lymphadenopathy. Skin: Warm and sweaty.  No jaundice.  No suspicious rashes or lesions. Cardiac: RRR, nl S1-S2, no murmurs appreciated.  Capillary refill is brisk.  JVP normal.  No  LE edema.  Radial and DP pulses 2+ and symmetric. Respiratory: Normal respiratory rate and rhythm.  CTAB without rales or wheezes. Abdomen: Abdomen soft.  Moderate TTP. No ascites, distension, hepatosplenomegaly.   MSK: No deformities or effusions.  No cyanosis or clubbing. Flank pain on right more than left. Neuro: Cranial nerves normal.  Sensation intact to light touch. Speech is fluent.  Muscle strength normal.    Psych: Sensorium intact and responding to questions, attention normal.  Behavior appropriate.  Affect normal.  Judgment and insight appear normal.     Labs on Admission:  I have personally reviewed following labs and imaging studies: CBC:  Recent Labs Lab 07/30/16 1703 07/31/16 0542  WBC 18.6* 13.9*  NEUTROABS 13.6*  --   HGB 15.6 13.0  HCT 44.5 38.4*  MCV 79.0 78.7  PLT 402* 318   Basic Metabolic Panel:  Recent Labs Lab 07/30/16 1703  NA 136  K 3.9  CL 101  CO2 22  GLUCOSE 99  BUN 13  CREATININE 0.94  CALCIUM 9.8   GFR: Estimated Creatinine Clearance: 133.7 mL/min (by C-G formula based on SCr of 0.94 mg/dL).  Liver Function Tests:  Recent Labs Lab 07/30/16 1703  AST 20  ALT 27  ALKPHOS 63  BILITOT 1.1  PROT 8.4*  ALBUMIN 5.0    Recent Labs Lab 07/30/16 1703  LIPASE 37   Sepsis Labs: Lactic acid 0.97      Radiological Exams on Admission: Personally reviewed CT report: Ct Abdomen Pelvis W Contrast  Result Date: 07/30/2016 CLINICAL DATA:  Acute onset of right flank pain and hematuria. Lower abdominal pain and bloating. Initial encounter. EXAM: CT ABDOMEN AND PELVIS WITH CONTRAST TECHNIQUE: Multidetector CT imaging of the abdomen and pelvis was performed using the standard protocol following bolus administration of intravenous contrast. CONTRAST:  ISOVUE-300 IOPAMIDOL (ISOVUE-300) INJECTION 61% COMPARISON:  None. FINDINGS: Lower chest: The visualized lung bases are grossly clear. The visualized portions of the mediastinum are  unremarkable. Hepatobiliary: The liver is unremarkable in appearance. The gallbladder is unremarkable in appearance. The common bile duct remains normal in caliber. Pancreas: The pancreas is within normal limits. Spleen: The spleen is unremarkable in appearance. Adrenals/Urinary Tract: The adrenal glands are unremarkable in appearance. There is wall enhancement at the renal pelves bilaterally, concerning for infectious pyelitis. Diffuse soft tissue density is seen tracking along both ureters, worse on the right. This is suspicious for extensive infectious phlegmon secondary to bilateral ureteritis. Given the patient's gross hematuria, underlying hemorrhage secondary to ureteritis cannot be excluded. There is no evidence of hydronephrosis. The kidneys are otherwise grossly unremarkable. Mild bilateral perinephric fluid is noted. No renal or ureteral stones are identified. Stomach/Bowel: The stomach is unremarkable in appearance. The small bowel is within normal limits. The appendix is normal in caliber, without evidence of appendicitis. The colon is unremarkable in appearance. Vascular/Lymphatic: Minimal calcification is noted at the distal abdominal aorta. No retroperitoneal or pelvic sidewall lymphadenopathy is seen. Reproductive: The bladder is mildly distended grossly remarkable. The prostate remains normal  in size. Other: No additional soft tissue abnormalities are seen. Musculoskeletal: No acute osseous abnormalities are identified. The visualized musculature is unremarkable in appearance. IMPRESSION: Wall enhancement at the renal pelves bilaterally, concerning for infectious pyelitis. Diffuse soft tissue density tracking along both ureters, worse on the right. This is suspicious for extensive infectious phlegmon secondary to bilateral ureteritis. Given the patient's gross hematuria, underlying hemorrhage secondary to ureteritis cannot be excluded. Electronically Signed   By: Roanna RaiderJeffery  Chang M.D.   On:  07/30/2016 19:48            Assessment/Plan  1. Acute pyelonephritis:  CT imaging shows particularly severe bilateral pyelonephritis with possible phlegmon formation   -IV cefepime -Consult to Urology -IV fluids  2. Seizures: Last was >3 years ago he claims (chart suggests it was 14 months ago).  Does not take an AED.  3. Hypertension:  Not controlled       DVT prophylaxis: SCDs  Code Status: FULL  Family Communication: None present  Disposition Plan: Anticipate IV fluids and antibiotics Consults called: None overnight Admission status: INPATIENT    Medical decision making: Patient seen at 10:15 PM on 07/30/2016.  The patient was discussed with Dr. Jacqulyn BathLong.  What exists of the patient's chart was reviewed in depth and summarized above.  Clinical condition: hemodynamics stable.        Alberteen SamChristopher P Danford [facility] Pager [phone]

## 2016-08-01 DIAGNOSIS — R310 Gross hematuria: Secondary | ICD-10-CM

## 2016-08-01 DIAGNOSIS — K5900 Constipation, unspecified: Secondary | ICD-10-CM

## 2016-08-01 DIAGNOSIS — R52 Pain, unspecified: Secondary | ICD-10-CM

## 2016-08-01 DIAGNOSIS — R109 Unspecified abdominal pain: Secondary | ICD-10-CM

## 2016-08-01 LAB — CBC WITH DIFFERENTIAL/PLATELET
BASOS ABS: 0 10*3/uL (ref 0.0–0.1)
Basophils Relative: 0 %
Eosinophils Absolute: 0.2 10*3/uL (ref 0.0–0.7)
Eosinophils Relative: 2 %
HCT: 35.1 % — ABNORMAL LOW (ref 39.0–52.0)
Hemoglobin: 12 g/dL — ABNORMAL LOW (ref 13.0–17.0)
LYMPHS PCT: 19 %
Lymphs Abs: 2.4 10*3/uL (ref 0.7–4.0)
MCH: 26.6 pg (ref 26.0–34.0)
MCHC: 34.2 g/dL (ref 30.0–36.0)
MCV: 77.8 fL — AB (ref 78.0–100.0)
Monocytes Absolute: 1.4 10*3/uL — ABNORMAL HIGH (ref 0.1–1.0)
Monocytes Relative: 11 %
NEUTROS ABS: 8.7 10*3/uL — AB (ref 1.7–7.7)
Neutrophils Relative %: 68 %
Platelets: 304 10*3/uL (ref 150–400)
RBC: 4.51 MIL/uL (ref 4.22–5.81)
RDW: 14.8 % (ref 11.5–15.5)
WBC: 12.8 10*3/uL — ABNORMAL HIGH (ref 4.0–10.5)

## 2016-08-01 LAB — BASIC METABOLIC PANEL
ANION GAP: 9 (ref 5–15)
BUN: 17 mg/dL (ref 6–20)
CO2: 23 mmol/L (ref 22–32)
Calcium: 8.5 mg/dL — ABNORMAL LOW (ref 8.9–10.3)
Chloride: 106 mmol/L (ref 101–111)
Creatinine, Ser: 1.37 mg/dL — ABNORMAL HIGH (ref 0.61–1.24)
GFR calc Af Amer: >60 mL/min (ref >60)
GFR calc non Af Amer: >60 mL/min (ref >60)
GLUCOSE: 96 mg/dL (ref 65–99)
POTASSIUM: 3.6 mmol/L (ref 3.5–5.1)
Sodium: 138 mmol/L (ref 135–145)

## 2016-08-01 LAB — MAGNESIUM: Magnesium: 1.9 mg/dL (ref 1.7–2.4)

## 2016-08-01 LAB — TSH: TSH: 2.358 u[IU]/mL (ref 0.350–4.500)

## 2016-08-01 LAB — GC/CHLAMYDIA PROBE AMP (~~LOC~~) NOT AT ARMC
Chlamydia: NEGATIVE
NEISSERIA GONORRHEA: NEGATIVE

## 2016-08-01 LAB — URINE CULTURE
CULTURE: NO GROWTH
Special Requests: NORMAL

## 2016-08-01 LAB — LACTIC ACID, PLASMA: Lactic Acid, Venous: 0.6 mmol/L (ref 0.5–1.9)

## 2016-08-01 MED ORDER — POLYETHYLENE GLYCOL 3350 17 G PO PACK
17.0000 g | PACK | Freq: Two times a day (BID) | ORAL | Status: DC
  Administered 2016-08-01 – 2016-08-04 (×6): 17 g via ORAL
  Filled 2016-08-01 (×6): qty 1

## 2016-08-01 MED ORDER — TRAMADOL-ACETAMINOPHEN 37.5-325 MG PO TABS: 1.0000 | ORAL_TABLET | ORAL | Status: DC | PRN

## 2016-08-01 MED ORDER — BISACODYL 10 MG RE SUPP
10.0000 mg | Freq: Once | RECTAL | Status: AC
  Administered 2016-08-01: 10 mg via RECTAL
  Filled 2016-08-01: qty 1

## 2016-08-01 MED ORDER — LUBIPROSTONE 24 MCG PO CAPS
24.0000 ug | ORAL_CAPSULE | Freq: Two times a day (BID) | ORAL | Status: DC
  Administered 2016-08-01 – 2016-08-04 (×6): 24 ug via ORAL
  Filled 2016-08-01 (×6): qty 1

## 2016-08-01 MED ORDER — OXYCODONE HCL 5 MG PO TABS
5.0000 mg | ORAL_TABLET | ORAL | Status: DC | PRN
  Administered 2016-08-01 – 2016-08-02 (×4): 5 mg via ORAL
  Filled 2016-08-01 (×4): qty 1

## 2016-08-01 NOTE — Progress Notes (Addendum)
PROGRESS NOTE    Brian Gilbert  ZOX:[id number] DOB: 02-10-1976 DOA: 07/30/2016 PCP: Patient, No Pcp Per   Chief Complaint  Patient presents with  . Abdominal Pain  . Flank Pain    right    Brief Narrative:  HPI on 07/30/2016 by Dr. Joen Laura Arling Cerone is a 41 y.o. male with a past medical history significant for seizures not on AED who presents with hematuria and flank pain. The patient was in his usual state of health until the last few days he developed "bloating" abdominal pain that he attributed to his chronic constipation.  It did not improve with magnesium PO and he started to have right flank pain as well.  Then in the last 24 hours, he started to notice gross hematuria, at first like "grapefruit juice" then later "like blood orange juice".  In the meantime, he started to have chills, sweats and worsening flank pain that weren't getting better and so today he came to the ER. Assessment & Plan   Acute pyelonephritis/ureteritis with gross hematuria -CT abdomen pelvis with contrast showed infectious pyelitis, suspicion for extensive infectious phlegmon secondary to bilateral ureteritis -Blood culture showed no growth to date -Urine culture shows no gross -Urology consulted and appreciated -Continue pain control, IV morphine and oxycodone added -Continue cefepime -Hemoglobin stable, currently 12 however was 15.6 upon admission. Question whether this is due to  dilutional component as patient may have been hemoconcentrated on admission -Continue to monitor CBC  Leukocytosis -Improving, likely secondary to the above -Continue monitor CBC  Acute kidney injury -Possibly secondary to ibuprofen versus the above -Creatinine currently 1.37 -Ibuprofen discontinued, continue IV fluids -Monitor BMP  Seizure disorder -Stable, last seizure approximately more than 3 years ago however chart suggests approximate 14 months ago. -Patient does not take AED. Although home meds  listed Keppra, which he supposedly took for 1-2 months in 2015  Essential Hypertension -BP currently stable, suspect situational due to pain -Will order IV hydralazine as needed  Chronic constipation -patient has to use suppositories on a regular basis.  -family history of cancer (unknown type- uncle age 55) -Given suppository as well as laxative with no effect -Discussed with Dr. Ewing Schlein, gastroenterology, who recommended continuing MiraLAX, twice daily. Also recommended starting Amitiza with outpatient follow-up. Discussed lack of insurance, office can provide patient with Amitiza samples.  Social -Currently has no insurance, or PCP -Will consult case management  DVT Prophylaxis  SCDs  Code Status: Full  Family Communication: None at bedside. Mom via phone   Disposition Plan: Admitted, home when stable  Consultants Gastroenterology, via phone, Dr. Ewing Schlein Urology  Procedures  None  Antibiotics   Anti-infectives    Start     Dose/Rate Route Frequency Ordered Stop   07/31/16 0000  ceFEPIme (MAXIPIME) 1 g in dextrose 5 % 50 mL IVPB     1 g 100 mL/hr over 30 Minutes Intravenous Every 12 hours 07/30/16 2208     07/30/16 2145  levofloxacin (LEVAQUIN) IVPB 750 mg  Status:  Discontinued     750 mg 100 mL/hr over 90 Minutes Intravenous  Once 07/30/16 2131 07/30/16 2208   07/30/16 2145  aztreonam (AZACTAM) 2 g in dextrose 5 % 50 mL IVPB  Status:  Discontinued     2 g 100 mL/hr over 30 Minutes Intravenous  Once 07/30/16 2131 07/30/16 2208   07/30/16 2015  levofloxacin (LEVAQUIN) IVPB 750 mg  Status:  Discontinued     750 mg 100 mL/hr over 90 Minutes Intravenous  Once 07/30/16 2003 07/30/16 2008   07/30/16 2015  aztreonam (AZACTAM) 2 g in dextrose 5 % 50 mL IVPB  Status:  Discontinued     2 g 100 mL/hr over 30 Minutes Intravenous  Once 07/30/16 2003 07/30/16 2008   07/30/16 2015  cefTRIAXone (ROCEPHIN) 1 g in dextrose 5 % 50 mL IVPB     1 g 100 mL/hr over 30 Minutes Intravenous   Once 07/30/16 2008 07/30/16 2115      Subjective:   Brian Gilbert seen and examined today.  Patient continues to complain of pain, states it is an 8-9 out of 10 sharp and stabbing. Feels that the Tylenol and well as tramadol are not helping. Denies chest pain, shortness of breath, abdominal pain, nausea vomiting, diarrhea. Does complain of chronic constipation.  Objective:   Vitals:   07/31/16 2012 07/31/16 2055 08/01/16 0457 08/01/16 1400  BP: (!) 161/100 (!) 153/92 (!) 152/78 140/87  Pulse: 76 72 60 (!) 56  Resp: 20 18 18 17   Temp: 99.3 F (37.4 C)  98.7 F (37.1 C) 98.2 F (36.8 C)  TempSrc: Oral  Oral Oral  SpO2: 100% 100% 99% 100%  Weight:      Height:        Intake/Output Summary (Last 24 hours) at 08/01/16 1457 Last data filed at 08/01/16 1400  Gross per 24 hour  Intake             2780 ml  Output             4800 ml  Net            -2020 ml   Filed Weights   07/30/16 1544  Weight: 108.9 kg (240 lb)   Exam  General: Well developed, well nourished, NAD, appears stated age  HEENT: NCAT, mucous membranes moist.   Cardiovascular: S1 S2 auscultated, no rubs, murmurs or gallops. Regular rate and rhythm.  Respiratory: Clear to auscultation bilaterally with equal chest rise  Abdomen: Soft, suprapubic TTP, nondistended, + bowel sounds  Back/MSK: CVA tenderness  Extremities: warm dry without cyanosis clubbing or edema  Neuro: AAOx3, nonfocal  Psych: Normal affect and demeanor with intact judgement and insight   Data Reviewed: I have personally reviewed following labs and imaging studies  CBC:  Recent Labs Lab 07/30/16 1703 07/31/16 0542 08/01/16 0544  WBC 18.6* 13.9* 12.8*  NEUTROABS 13.6*  --  8.7*  HGB 15.6 13.0 12.0*  HCT 44.5 38.4* 35.1*  MCV 79.0 78.7 77.8*  PLT 402* 318 304   Basic Metabolic Panel:  Recent Labs Lab 07/30/16 1703 07/31/16 0542 08/01/16 0544  NA 136 137 138  K 3.9 3.7 3.6  CL 101 105 106  CO2 22 25 23   GLUCOSE 99  103* 96  BUN 13 16 17   CREATININE 0.94 1.08 1.37*  CALCIUM 9.8 8.7* 8.5*  MG  --   --  1.9   GFR: Estimated Creatinine Clearance: 91.7 mL/min (A) (by C-G formula based on SCr of 1.37 mg/dL (H)). Liver Function Tests:  Recent Labs Lab 07/30/16 1703  AST 20  ALT 27  ALKPHOS 63  BILITOT 1.1  PROT 8.4*  ALBUMIN 5.0    Recent Labs Lab 07/30/16 1703  LIPASE 37   No results for input(s): AMMONIA in the last 168 hours. Coagulation Profile: No results for input(s): INR, PROTIME in the last 168 hours. Cardiac Enzymes: No results for input(s): CKTOTAL, CKMB, CKMBINDEX, TROPONINI in the last 168 hours. BNP (last 3 results)  No results for input(s): PROBNP in the last 8760 hours. HbA1C: No results for input(s): HGBA1C in the last 72 hours. CBG: No results for input(s): GLUCAP in the last 168 hours. Lipid Profile: No results for input(s): CHOL, HDL, LDLCALC, TRIG, CHOLHDL, LDLDIRECT in the last 72 hours. Thyroid Function Tests:  Recent Labs  08/01/16 0544  TSH 2.358   Anemia Panel: No results for input(s): VITAMINB12, FOLATE, FERRITIN, TIBC, IRON, RETICCTPCT in the last 72 hours. Urine analysis:    Component Value Date/Time   COLORURINE RED (A) 07/30/2016 1550   APPEARANCEUR TURBID (A) 07/30/2016 1550   LABSPEC  07/30/2016 1550    TEST NOT REPORTED DUE TO COLOR INTERFERENCE OF URINE PIGMENT   PHURINE  07/30/2016 1550    TEST NOT REPORTED DUE TO COLOR INTERFERENCE OF URINE PIGMENT   GLUCOSEU (A) 07/30/2016 1550    TEST NOT REPORTED DUE TO COLOR INTERFERENCE OF URINE PIGMENT   HGBUR (A) 07/30/2016 1550    TEST NOT REPORTED DUE TO COLOR INTERFERENCE OF URINE PIGMENT   BILIRUBINUR (A) 07/30/2016 1550    TEST NOT REPORTED DUE TO COLOR INTERFERENCE OF URINE PIGMENT   KETONESUR (A) 07/30/2016 1550    TEST NOT REPORTED DUE TO COLOR INTERFERENCE OF URINE PIGMENT   PROTEINUR (A) 07/30/2016 1550    TEST NOT REPORTED DUE TO COLOR INTERFERENCE OF URINE PIGMENT   UROBILINOGEN  1.0 04/29/2007 2046   NITRITE (A) 07/30/2016 1550    TEST NOT REPORTED DUE TO COLOR INTERFERENCE OF URINE PIGMENT   LEUKOCYTESUR (A) 07/30/2016 1550    TEST NOT REPORTED DUE TO COLOR INTERFERENCE OF URINE PIGMENT   Sepsis Labs: @LABRCNTIP (procalcitonin:4,lacticidven:4)  ) Recent Results (from the past 240 hour(s))  Urine culture     Status: None   Collection Time: 07/30/16  3:50 PM  Result Value Ref Range Status   Specimen Description URINE, CLEAN CATCH  Final   Special Requests Normal  Final   Culture   Final    NO GROWTH Performed at [facility], [address] [place], [place] [address]    Report Status 08/01/2016 FINAL  Final  Blood Culture (routine x 2)     Status: None (Preliminary result)   Collection Time: 07/30/16  8:20 PM  Result Value Ref Range Status   Specimen Description LEFT ANTECUBITAL  Final   Special Requests   Final    BOTTLES DRAWN AEROBIC AND ANAEROBIC Blood Culture adequate volume   Culture   Final    NO GROWTH < 24 HOURS Performed at [facility], [address] [place], [place] [address]    Report Status PENDING  Incomplete  Blood Culture (routine x 2)     Status: None (Preliminary result)   Collection Time: 07/30/16  8:33 PM  Result Value Ref Range Status   Specimen Description RIGHT ANTECUBITAL  Final   Special Requests IN PEDIATRIC BOTTLE Blood Culture adequate volume  Final   Culture   Final    NO GROWTH < 24 HOURS Performed at [facility], [address] [place], [place] [address]    Report Status PENDING  Incomplete  MRSA PCR Screening     Status: None   Collection Time: 07/31/16 11:30 AM  Result Value Ref Range Status   MRSA by PCR NEGATIVE NEGATIVE Final    Comment:        The GeneXpert MRSA Assay (FDA approved for NASAL specimens only), is one component of a comprehensive MRSA colonization  surveillance program. It is not intended to diagnose MRSA infection nor to guide or monitor treatment for MRSA  infections.       Radiology Studies: Ct Abdomen Pelvis W Contrast  Result Date: 07/30/2016 CLINICAL DATA:  Acute onset of right flank pain and hematuria. Lower abdominal pain and bloating. Initial encounter. EXAM: CT ABDOMEN AND PELVIS WITH CONTRAST TECHNIQUE: Multidetector CT imaging of the abdomen and pelvis was performed using the standard protocol following bolus administration of intravenous contrast. CONTRAST:  ISOVUE-300 IOPAMIDOL (ISOVUE-300) INJECTION 61% COMPARISON:  None. FINDINGS: Lower chest: The visualized lung bases are grossly clear. The visualized portions of the mediastinum are unremarkable. Hepatobiliary: The liver is unremarkable in appearance. The gallbladder is unremarkable in appearance. The common bile duct remains normal in caliber. Pancreas: The pancreas is within normal limits. Spleen: The spleen is unremarkable in appearance. Adrenals/Urinary Tract: The adrenal glands are unremarkable in appearance. There is wall enhancement at the renal pelves bilaterally, concerning for infectious pyelitis. Diffuse soft tissue density is seen tracking along both ureters, worse on the right. This is suspicious for extensive infectious phlegmon secondary to bilateral ureteritis. Given the patient's gross hematuria, underlying hemorrhage secondary to ureteritis cannot be excluded. There is no evidence of hydronephrosis. The kidneys are otherwise grossly unremarkable. Mild bilateral perinephric fluid is noted. No renal or ureteral stones are identified. Stomach/Bowel: The stomach is unremarkable in appearance. The small bowel is within normal limits. The appendix is normal in caliber, without evidence of appendicitis. The colon is unremarkable in appearance. Vascular/Lymphatic: Minimal calcification is noted at the distal abdominal aorta. No retroperitoneal or pelvic sidewall lymphadenopathy is seen. Reproductive: The bladder is mildly distended grossly remarkable. The prostate remains normal  in size. Other: No additional soft tissue abnormalities are seen. Musculoskeletal: No acute osseous abnormalities are identified. The visualized musculature is unremarkable in appearance. IMPRESSION: Wall enhancement at the renal pelves bilaterally, concerning for infectious pyelitis. Diffuse soft tissue density tracking along both ureters, worse on the right. This is suspicious for extensive infectious phlegmon secondary to bilateral ureteritis. Given the patient's gross hematuria, underlying hemorrhage secondary to ureteritis cannot be excluded. Electronically Signed   By: Roanna Raider M.D.   On: 07/30/2016 19:48     Scheduled Meds: . polyethylene glycol  17 g Oral Daily  . senna-docusate  1 tablet Oral BID   Continuous Infusions: . sodium chloride 100 mL/hr (08/01/16 0504)  . ceFEPime (MAXIPIME) IV Stopped (08/01/16 0954)     LOS: 2 days   Time Spent in minutes   45 minutes (discussing care with patient, mother, consults, and reviewing chart)  Amalio Loe D.O. on 08/01/2016 at 2:57 PM  Between 7am to 7pm - Pager - [phone]  After 7pm go to [email or web address] - password [email or web address]  And look for the night coverage person covering for me after hours  [facility] Office  [phone]

## 2016-08-01 NOTE — Consult Note (Signed)
Urology Consult  Consulting MD: Judie PetitM. Mikhail,MD  CC:   HPI: This is a 41 year old male , native of [place] who presented to [facility] 2 days ago with a 24 hr history of gross hematuria. He relates taking a significant amout of magnesium supplements for what he thought was constipation for several days before his presentation. He was experiencing abdominal cramps as well as small BMs. He denies prior hematuria, UTI's, stone disease, GU issues. He denies F/C, N/V. He denies h/o IVDA. He is married, monogamous.  In the ER, WBC was 18.6 k, SCr 0.94. CT A/P revealed nml renal parenchyma, bilateral (R>L) perihilar/pelvic/ureteral inflammation with perinephric fluid. Phone consult from an AUS physician suggested abx mgmnt. Urine C&S has been negative, WBC count has been decreasing and urine has been clearing somewhat. Pt has been feeling better w/o significant abd pain now.   PMH: Past Medical History:  Diagnosis Date  . Hypertension   . Seizures (HCC)     PSH: History reviewed. No pertinent surgical history.  Allergies: Allergies  Allergen Reactions  . Penicillins Anaphylaxis    Has patient had a PCN reaction causing immediate rash, facial/tongue/throat swelling, SOB or lightheadedness with hypotension: yes Has patient had a PCN reaction causing severe rash involving mucus membranes or skin necrosis: no Has patient had a PCN reaction that required hospitalization: no Has patient had a PCN reaction occurring within the last 10 years? no If all of the above answers are "NO", then may proceed with Cephalosporin use.    Medications: Prescriptions Prior to Admission  Medication Sig Dispense Refill Last Dose  . magnesium citrate SOLN Take 1 Bottle by mouth once.   07/30/2016 at Unknown time  . phenylephrine-shark liver oil-mineral oil-petrolatum (PREPARATION H) 0.25-3-14-71.9 % rectal ointment Place 1 application rectally 2 (two) times daily as needed for hemorrhoids.   07/30/2016 at Unknown time   . levETIRAcetam (KEPPRA) 500 MG tablet Take 1 tablet (500 mg total) by mouth 2 (two) times daily. (Patient not taking: Reported on 07/30/2016) 30 tablet 0 Completed Course at Unknown time     Social History: Social History   Social History  . Marital status: Single    Spouse name: N/A  . Number of children: N/A  . Years of education: N/A   Occupational History  . Not on file.   Social History Main Topics  . Smoking status: Current Every Day Smoker  . Smokeless tobacco: Never Used  . Alcohol use No  . Drug use: Yes    Types: Marijuana     Comment: occasional  . Sexual activity: Not on file   Other Topics Concern  . Not on file   Social History Narrative  . No narrative on file    Family History: Family History  Problem Relation Age of Onset  . COPD Maternal Grandmother   . Hypertension Maternal Grandmother     Review of Systems: Positive: Gross hematuria, abd pain, fecal urgency Negative: .A further 10 point review of systems was negative except what is listed in the HPI.  Physical Exam: @VITALS2 @ General: No acute distress.  Awake. Head:  Normocephalic.  Atraumatic. ENT:  EOMI.  Mucous membranes moist Neck:  Supple.  No lymphadenopathy. CV:  . Regular rate. Pulmonary: Equal effort bilaterally.   Abdomen: Soft.  Mildly obese. Minimal R>L LQ tenderness, no mass/guarding Skin:  Normal turgor.  No visible rash. Extremity: No gross deformity of bilateral upper extremities.  No gross deformity of bilateral lower extremities. Neurologic: Alert. Appropriate  mood. Back:                Mild R CVAT    Studies:  Recent Labs     07/31/16  0542  08/01/16  0544  HGB  13.0  12.0*  WBC  13.9*  12.8*  PLT  318  304    Recent Labs     07/31/16  0542  08/01/16  0544  NA  137  138  K  3.7  3.6  CL  105  106  CO2  25  23  BUN  16  17  CREATININE  1.08  1.37*  CALCIUM  8.7*  8.5*  GFRNONAA  >60  >60  GFRAA  >60  >60    I personally reviewed pt's labs/CT  images No results for input(s): INR, APTT in the last 72 hours.  Invalid input(s): PT   Invalid input(s): ABG    Assessment:  Gross hematuria/peripelvic inflammation bilaterally Etiology unknown but DDX: *infectious although pt not febrile, cultures neg. I've seen pyelo have neg cultures *inflammatory in nature (question autoimmune process). Pt did not have prodromal sx's *?related to magnesium intake  Plan: As pt is improving, I don't think he needs repeat imaging @ present. Despite neg cultures I would keep him on ~10 days of an abx--ok to switch to a broad spectrum oral med such as sulfa or keflex. I would expect hematuria to improve, but would not wait until it clears to d/c him. He'll need f/u in our office, and repeat CT if condition worsens or hematuria persists for longer than expected. Would repeat CBC (I do not think drop in hgb from the relatively small amt of blood that has been in his urine)  Will cont to follow  45 mins spent face to face w/ pt    [phone]

## 2016-08-02 ENCOUNTER — Inpatient Hospital Stay (HOSPITAL_COMMUNITY): Payer: Medicaid Other

## 2016-08-02 LAB — TSH: TSH: 4.128 u[IU]/mL (ref 0.350–4.500)

## 2016-08-02 LAB — CBC
HCT: 32.2 % — ABNORMAL LOW (ref 39.0–52.0)
HEMOGLOBIN: 11.1 g/dL — AB (ref 13.0–17.0)
MCH: 26.9 pg (ref 26.0–34.0)
MCHC: 34.5 g/dL (ref 30.0–36.0)
MCV: 78 fL (ref 78.0–100.0)
Platelets: 286 10*3/uL (ref 150–400)
RBC: 4.13 MIL/uL — ABNORMAL LOW (ref 4.22–5.81)
RDW: 14.9 % (ref 11.5–15.5)
WBC: 10.3 10*3/uL (ref 4.0–10.5)

## 2016-08-02 LAB — BASIC METABOLIC PANEL
Anion gap: 7 (ref 5–15)
BUN: 13 mg/dL (ref 6–20)
CHLORIDE: 108 mmol/L (ref 101–111)
CO2: 24 mmol/L (ref 22–32)
CREATININE: 1.27 mg/dL — AB (ref 0.61–1.24)
Calcium: 8.4 mg/dL — ABNORMAL LOW (ref 8.9–10.3)
GFR calc Af Amer: >60 mL/min (ref >60)
GFR calc non Af Amer: >60 mL/min (ref >60)
Glucose, Bld: 101 mg/dL — ABNORMAL HIGH (ref 65–99)
Potassium: 3.8 mmol/L (ref 3.5–5.1)
SODIUM: 139 mmol/L (ref 135–145)

## 2016-08-02 LAB — SEDIMENTATION RATE: Sed Rate: 49 mm/hr — ABNORMAL HIGH (ref 0–16)

## 2016-08-02 LAB — T4, FREE: Free T4: 0.92 ng/dL (ref 0.61–1.12)

## 2016-08-02 LAB — C-REACTIVE PROTEIN: CRP: 13.8 mg/dL — ABNORMAL HIGH (ref <1.0)

## 2016-08-02 MED ORDER — LORAZEPAM 2 MG/ML IJ SOLN
1.0000 mg | Freq: Once | INTRAMUSCULAR | Status: DC
Start: 2016-08-02 — End: 2016-08-04

## 2016-08-02 MED ORDER — OXYCODONE HCL 5 MG PO TABS
10.0000 mg | ORAL_TABLET | ORAL | Status: DC
  Administered 2016-08-02 – 2016-08-04 (×13): 10 mg via ORAL
  Filled 2016-08-02 (×13): qty 2

## 2016-08-02 NOTE — Progress Notes (Signed)
Dr. Butler Denmarkizwan aware via phone pt unable to tolerate MRI due to claustrophobia. Order received for Ativan IV prior to next attempt at MRI.

## 2016-08-02 NOTE — Progress Notes (Addendum)
Assessment and Plan: Gross hematuria with pyelitis and a retroperitoneal phlegmon right > left with mild ARI.    His symptoms are not consistent with typical pyelonephritis and I would be concerned about a non-typical infection such as TB or an inflammatory process with possible retroperitoneal fibrosis.   I believe his GI symptoms are related to the retroperitoneal process.    I have ordered a Quantiferon test.   He also needs a contrast enhanced scan to better clarify the nature of the process and I think an MRI abdomen/pelvis with/without contrast will give the most information.  His GFR est is >60 so Gadolinium should be ok.   Additionally, if retroperitoneal fibrosis is a concern which I think it is, further testing with an ESR, CRP along with ANA, IgG4 and ANCA should be considered.  There was also a recommendation in Up to Date to check Thyroid function studies and antibodies against Thyroid Microsome and Thyroglobulin.     Subjective: Mr. Arth reports persistent bloating with a concern that he is not able to have a good bowel movement but the CT showed a reasonably decompressed bowel.   He has persistent hematuria but the urine is now a deep tea color.  He has no dysuria or frequency but he has persistent right flank pain abdominal pain that is mild to moderate.   He has no fever or malaise.  His Cr was up to 1.37 yesterday but is back down to 1.27. ROS:  Review of Systems  Constitutional: Negative for chills, fever, malaise/fatigue and weight loss.  Respiratory: Negative for cough and shortness of breath.   Cardiovascular: Negative for chest pain and leg swelling.  Gastrointestinal: Negative for nausea and vomiting.       Bloating  Genitourinary: Positive for hematuria. Negative for dysuria, frequency and urgency.  Neurological: Negative.   Psychiatric/Behavioral: The patient is nervous/anxious.   All other systems reviewed and are negative.   Anti-infectives: Anti-infectives    Start     Dose/Rate Route Frequency Ordered Stop   07/31/16 0000  ceFEPIme (MAXIPIME) 1 g in dextrose 5 % 50 mL IVPB     1 g 100 mL/hr over 30 Minutes Intravenous Every 12 hours 07/30/16 2208     07/30/16 2145  levofloxacin (LEVAQUIN) IVPB 750 mg  Status:  Discontinued     750 mg 100 mL/hr over 90 Minutes Intravenous  Once 07/30/16 2131 07/30/16 2208   07/30/16 2145  aztreonam (AZACTAM) 2 g in dextrose 5 % 50 mL IVPB  Status:  Discontinued     2 g 100 mL/hr over 30 Minutes Intravenous  Once 07/30/16 2131 07/30/16 2208   07/30/16 2015  levofloxacin (LEVAQUIN) IVPB 750 mg  Status:  Discontinued     750 mg 100 mL/hr over 90 Minutes Intravenous  Once 07/30/16 2003 07/30/16 2008   07/30/16 2015  aztreonam (AZACTAM) 2 g in dextrose 5 % 50 mL IVPB  Status:  Discontinued     2 g 100 mL/hr over 30 Minutes Intravenous  Once 07/30/16 2003 07/30/16 2008   07/30/16 2015  cefTRIAXone (ROCEPHIN) 1 g in dextrose 5 % 50 mL IVPB     1 g 100 mL/hr over 30 Minutes Intravenous  Once 07/30/16 2008 07/30/16 2115      Current Facility-Administered Medications  Medication Dose Route Frequency Provider Last Rate Last Dose  . 0.9 %  sodium chloride infusion   Intravenous Continuous Edwin Dada, MD 100 mL/hr at 08/02/16 0216 100 mL/hr at 08/02/16 0216  .  acetaminophen (TYLENOL) tablet 650 mg  650 mg Oral Q6H PRN Edwin Dada, MD   650 mg at 08/02/16 0449   Or  . acetaminophen (TYLENOL) suppository 650 mg  650 mg Rectal Q6H PRN Danford, Suann Larry, MD      . bisacodyl (DULCOLAX) EC tablet 5 mg  5 mg Oral BID PRN Edwin Dada, MD   5 mg at 07/31/16 1046  . ceFEPIme (MAXIPIME) 1 g in dextrose 5 % 50 mL IVPB  1 g Intravenous Q12H Danford, Suann Larry, MD   Stopped at 08/01/16 2256  . lubiprostone (AMITIZA) capsule 24 mcg  24 mcg Oral BID WC Cristal Ford, DO   24 mcg at 08/02/16 0817  . morphine 2 MG/ML injection 2 mg  2 mg Intravenous Q4H PRN Florencia Reasons, MD   2 mg at 08/02/16  0817  . ondansetron (ZOFRAN) tablet 4 mg  4 mg Oral Q6H PRN Danford, Suann Larry, MD       Or  . ondansetron (ZOFRAN) injection 4 mg  4 mg Intravenous Q6H PRN Danford, Suann Larry, MD      . oxyCODONE (Oxy IR/ROXICODONE) immediate release tablet 5 mg  5 mg Oral Q4H PRN Cristal Ford, DO   5 mg at 08/02/16 0443  . polyethylene glycol (MIRALAX / GLYCOLAX) packet 17 g  17 g Oral BID Cristal Ford, DO   17 g at 08/01/16 2231  . senna-docusate (Senokot-S) tablet 1 tablet  1 tablet Oral BID Florencia Reasons, MD   1 tablet at 08/01/16 2232  . traMADol-acetaminophen (ULTRACET) 37.5-325 MG per tablet 1-2 tablet  1-2 tablet Oral Q4H PRN Cristal Ford, DO         Objective: Vital signs in last 24 hours: Temp:  [98.2 F (36.8 C)-98.7 F (37.1 C)] 98.2 F (36.8 C) (06/21 0548) Pulse Rate:  [56-64] 61 (06/21 0548) Resp:  [16-18] 16 (06/21 0548) BP: (136-141)/(83-92) 136/83 (06/21 0548) SpO2:  [97 %-100 %] 98 % (06/21 0548)  Intake/Output from previous day: 06/20 0701 - 06/21 0700 In: 1440 [P.O.:240; I.V.:1200] Out: 2575 [Urine:2575] Intake/Output this shift: No intake/output data recorded.   Physical Exam  Constitutional: He is oriented to person, place, and time and well-developed, well-nourished, and in no distress.  Abdominal: Soft. There is tenderness (right flank and RUQ). There is no rebound and no guarding.  Neurological: He is alert and oriented to person, place, and time.  Skin: Skin is warm and dry.  Vitals reviewed.   Lab Results:   Recent Labs  08/01/16 0544 08/02/16 0527  WBC 12.8* 10.3  HGB 12.0* 11.1*  HCT 35.1* 32.2*  PLT 304 286   BMET  Recent Labs  08/01/16 0544 08/02/16 0527  NA 138 139  K 3.6 3.8  CL 106 108  CO2 23 24  GLUCOSE 96 101*  BUN 17 13  CREATININE 1.37* 1.27*  CALCIUM 8.5* 8.4*   PT/INR No results for input(s): LABPROT, INR in the last 72 hours. ABG No results for input(s): PHART, HCO3 in the last 72 hours.  Invalid input(s):  PCO2, PO2  Studies/Results: No results found.   I have reviewed his labs and the CT films and report.   I have also reviewed his records in [place] and he has had MRSA treated in 2013 and 2016.  Screening was negative here.  Urine culture was negative.        LOS: 3 days    Malka So 08/02/2016 [phone] ID: Ethel Rana, male  DOB: Dec 05, 1975, 41 y.o.   MRN: [id number]

## 2016-08-02 NOTE — Progress Notes (Signed)
PROGRESS NOTE    Brian Gilbert   GEX:[id number]  DOB: February 27, 1975  DOA: 07/30/2016 PCP: Patient, No Pcp Per   Brief Narrative:  Brian Gilbert a 41 y.o.malewith a past medical history significant for seizures not on AEDwho presents with hematuria, chills, right abdominal and R flank pain. He states that he has chronic constipation and has been dealing with abdominal bloating lately as well. CT in the ER was suggestive of a urinary pathology with bilateral ureteritis (see report below). Urology was consulted.   Subjective: Significant amount of abdominal and back pain overnight to the point that he was in tears while waiting for his Morphine. Feels stomach is bloated and did not have any stool from the Dulcolax suppository yesterday.  Assessment & Plan:   Acute pyelonephritis/ureteritis with gross hematuria -CT abdomen pelvis with contrast showed infectious pyelitis, suspicion for extensive infectious phlegmon secondary to bilateral ureteritis -Blood culture and U cultures showed no growth to date -Urology consulted and appreciated -Continue pain control,  oxycodone doubled today (Q 4 hrs routine) as he states that his pain is severe - Morphine for breakthrough pain -Continue cefepime- leukocytosis improving - Urology asking for MRI with and without contrast and the following blood work - I have placed orders:  ESR, CRP along with ANA, IgG4 (could not order this one) and ANCA,Thyroid function studies and antibodies against Thyroid Microsome and Thyroglobulin.    - Urology will follow in AM    Acute kidney injury -Possibly secondary to ibuprofen versus the above -Creatinine currently 1.27 -Ibuprofen discontinued, continue IV fluids -Monitor BMP  Seizure disorder -Stable, last seizure approximately more than 3 years ago however chart suggests approximate 14 months ago. -Patient does not take AED. Although home meds listed Keppra, which he supposedly took for 1-2 months in  2015  Essential Hypertension -BP currently stable, suspect situational due to pain -Will order IV hydralazine as needed  Chronic constipation -patient has to use suppositories on a regular basis.  -family history of cancer (unknown type- uncle age 24) -Given suppository as well as laxative with no effect -Dr Ree Kida spoke with Dr. Watt Climes, gastroenterology, who recommended continuing MiraLAX, twice daily. Also recommended starting Amitiza with outpatient follow-up. Discussed lack of insurance, office can provide patient with Amitiza samples. - xray abd today does not show any significant stool burden- he has not been eating in past 4 days and I have encouraged him to start eating - follow up on constipation while on high dose narcotics for pain  Social -Currently has no insurance, or PCP -  case management consulted   DVT prophylaxis: SCDs Code Status: FULL code Family Communication: mother and brothers Disposition Plan: home when stable Consultants:   Urology Procedures:    Antimicrobials:  Anti-infectives    Start     Dose/Rate Route Frequency Ordered Stop   07/31/16 0000  ceFEPIme (MAXIPIME) 1 g in dextrose 5 % 50 mL IVPB     1 g 100 mL/hr over 30 Minutes Intravenous Every 12 hours 07/30/16 2208     07/30/16 2145  levofloxacin (LEVAQUIN) IVPB 750 mg  Status:  Discontinued     750 mg 100 mL/hr over 90 Minutes Intravenous  Once 07/30/16 2131 07/30/16 2208   07/30/16 2145  aztreonam (AZACTAM) 2 g in dextrose 5 % 50 mL IVPB  Status:  Discontinued     2 g 100 mL/hr over 30 Minutes Intravenous  Once 07/30/16 2131 07/30/16 2208   07/30/16 2015  levofloxacin (LEVAQUIN) IVPB 750 mg  Status:  Discontinued     750 mg 100 mL/hr over 90 Minutes Intravenous  Once 07/30/16 2003 07/30/16 2008   07/30/16 2015  aztreonam (AZACTAM) 2 g in dextrose 5 % 50 mL IVPB  Status:  Discontinued     2 g 100 mL/hr over 30 Minutes Intravenous  Once 07/30/16 2003 07/30/16 2008   07/30/16 2015   cefTRIAXone (ROCEPHIN) 1 g in dextrose 5 % 50 mL IVPB     1 g 100 mL/hr over 30 Minutes Intravenous  Once 07/30/16 2008 07/30/16 2115       Objective: Vitals:   08/01/16 1400 08/01/16 2044 08/02/16 0548 08/02/16 1440  BP: 140/87 (!) 141/92 136/83 132/79  Pulse: (!) 56 64 61 (!) 51  Resp: '17 18 16 16  ' Temp: 98.2 F (36.8 C) 98.7 F (37.1 C) 98.2 F (36.8 C) 98.7 F (37.1 C)  TempSrc: Oral Oral Oral Oral  SpO2: 100% 97% 98% 100%  Weight:      Height:        Intake/Output Summary (Last 24 hours) at 08/02/16 1631 Last data filed at 08/02/16 1440  Gross per 24 hour  Intake          4119.99 ml  Output             2875 ml  Net          1244.99 ml   Filed Weights   07/30/16 1544  Weight: 108.9 kg (240 lb)    Examination: General exam: Appears comfortable  HEENT: PERRLA, oral mucosa moist, no sclera icterus or thrush Respiratory system: Clear to auscultation. Respiratory effort normal. Cardiovascular system: S1 & S2 heard, RRR.  No murmurs  Gastrointestinal system: Abdomen soft,  Tender in right mild and lower quadrant with R CVA tenderness,  Mildly distended. Normal bowel sound. No organomegaly Central nervous system: Alert and oriented. No focal neurological deficits. Extremities: No cyanosis, clubbing or edema Skin: No rashes or ulcers Psychiatry:  Mood & affect appropriate.     Data Reviewed: I have personally reviewed following labs and imaging studies  CBC:  Recent Labs Lab 07/30/16 1703 07/31/16 0542 08/01/16 0544 08/02/16 0527  WBC 18.6* 13.9* 12.8* 10.3  NEUTROABS 13.6*  --  8.7*  --   HGB 15.6 13.0 12.0* 11.1*  HCT 44.5 38.4* 35.1* 32.2*  MCV 79.0 78.7 77.8* 78.0  PLT 402* 318 304 118   Basic Metabolic Panel:  Recent Labs Lab 07/30/16 1703 07/31/16 0542 08/01/16 0544 08/02/16 0527  NA 136 137 138 139  K 3.9 3.7 3.6 3.8  CL 101 105 106 108  CO2 '22 25 23 24  ' GLUCOSE 99 103* 96 101*  BUN '13 16 17 13  ' CREATININE 0.94 1.08 1.37* 1.27*    CALCIUM 9.8 8.7* 8.5* 8.4*  MG  --   --  1.9  --    GFR: Estimated Creatinine Clearance: 98.9 mL/min (A) (by C-G formula based on SCr of 1.27 mg/dL (H)). Liver Function Tests:  Recent Labs Lab 07/30/16 1703  AST 20  ALT 27  ALKPHOS 63  BILITOT 1.1  PROT 8.4*  ALBUMIN 5.0    Recent Labs Lab 07/30/16 1703  LIPASE 37   No results for input(s): AMMONIA in the last 168 hours. Coagulation Profile: No results for input(s): INR, PROTIME in the last 168 hours. Cardiac Enzymes: No results for input(s): CKTOTAL, CKMB, CKMBINDEX, TROPONINI in the last 168 hours. BNP (last 3 results) No results for input(s): PROBNP in the last 8760  hours. HbA1C: No results for input(s): HGBA1C in the last 72 hours. CBG: No results for input(s): GLUCAP in the last 168 hours. Lipid Profile: No results for input(s): CHOL, HDL, LDLCALC, TRIG, CHOLHDL, LDLDIRECT in the last 72 hours. Thyroid Function Tests:  Recent Labs  08/01/16 0544  TSH 2.358   Anemia Panel: No results for input(s): VITAMINB12, FOLATE, FERRITIN, TIBC, IRON, RETICCTPCT in the last 72 hours. Urine analysis:    Component Value Date/Time   COLORURINE RED (A) 07/30/2016 1550   APPEARANCEUR TURBID (A) 07/30/2016 1550   LABSPEC  07/30/2016 1550    TEST NOT REPORTED DUE TO COLOR INTERFERENCE OF URINE PIGMENT   PHURINE  07/30/2016 1550    TEST NOT REPORTED DUE TO COLOR INTERFERENCE OF URINE PIGMENT   GLUCOSEU (A) 07/30/2016 1550    TEST NOT REPORTED DUE TO COLOR INTERFERENCE OF URINE PIGMENT   HGBUR (A) 07/30/2016 1550    TEST NOT REPORTED DUE TO COLOR INTERFERENCE OF URINE PIGMENT   BILIRUBINUR (A) 07/30/2016 1550    TEST NOT REPORTED DUE TO COLOR INTERFERENCE OF URINE PIGMENT   KETONESUR (A) 07/30/2016 1550    TEST NOT REPORTED DUE TO COLOR INTERFERENCE OF URINE PIGMENT   PROTEINUR (A) 07/30/2016 1550    TEST NOT REPORTED DUE TO COLOR INTERFERENCE OF URINE PIGMENT   UROBILINOGEN 1.0 04/29/2007 2046   NITRITE (A)  07/30/2016 1550    TEST NOT REPORTED DUE TO COLOR INTERFERENCE OF URINE PIGMENT   LEUKOCYTESUR (A) 07/30/2016 1550    TEST NOT REPORTED DUE TO COLOR INTERFERENCE OF URINE PIGMENT   Sepsis Labs: '@LABRCNTIP' (procalcitonin:4,lacticidven:4) ) Recent Results (from the past 240 hour(s))  Urine culture     Status: None   Collection Time: 07/30/16  3:50 PM  Result Value Ref Range Status   Specimen Description URINE, CLEAN CATCH  Final   Special Requests Normal  Final   Culture   Final    NO GROWTH Performed at [facility], [place] [address] [place], [place] [address]    Report Status 08/01/2016 FINAL  Final  Blood Culture (routine x 2)     Status: None (Preliminary result)   Collection Time: 07/30/16  8:20 PM  Result Value Ref Range Status   Specimen Description LEFT ANTECUBITAL  Final   Special Requests   Final    BOTTLES DRAWN AEROBIC AND ANAEROBIC Blood Culture adequate volume   Culture   Final    NO GROWTH 3 DAYS Performed at [facility], [place] [address] [place], [place] [address]    Report Status PENDING  Incomplete  Blood Culture (routine x 2)     Status: None (Preliminary result)   Collection Time: 07/30/16  8:33 PM  Result Value Ref Range Status   Specimen Description RIGHT ANTECUBITAL  Final   Special Requests IN PEDIATRIC BOTTLE Blood Culture adequate volume  Final   Culture   Final    NO GROWTH 3 DAYS Performed at [facility], [place] [address], [place], [place] [address]    Report Status PENDING  Incomplete  MRSA PCR Screening     Status: None   Collection Time: 07/31/16 11:30 AM  Result Value Ref Range Status   MRSA by PCR NEGATIVE NEGATIVE Final    Comment:        The GeneXpert MRSA Assay (FDA approved for NASAL specimens only), is one component of a comprehensive MRSA colonization surveillance program. It is not intended to diagnose MRSA infection nor to  guide or monitor treatment for MRSA infections.          Radiology Studies: Dg  Abd Portable 1v  Result Date: 08/02/2016 CLINICAL DATA:  Constipation, flank pain, hematuria EXAM: PORTABLE ABDOMEN - 1 VIEW COMPARISON:  CT 07/30/2016 FINDINGS: Nonobstructive bowel gas pattern. Calcifications in the left lateral lower abdomen with shown on prior CT to be within the abdominal wall musculature. No free air organomegaly. No acute bony abnormality. IMPRESSION: No acute findings. Electronically Signed   By: Rolm Baptise M.D.   On: 08/02/2016 11:13      Scheduled Meds: . lubiprostone  24 mcg Oral BID WC  . oxyCODONE  10 mg Oral Q4H  . polyethylene glycol  17 g Oral BID  . senna-docusate  1 tablet Oral BID   Continuous Infusions: . sodium chloride 100 mL/hr at 08/02/16 1141  . ceFEPime (MAXIPIME) IV Stopped (08/02/16 1207)     LOS: 3 days    Time spent in minutes: 35    Debbe Odea, MD [facility] Pager: [email or web address] Password [facility] 08/02/2016, 4:31 PM

## 2016-08-03 ENCOUNTER — Inpatient Hospital Stay (HOSPITAL_COMMUNITY): Payer: Medicaid Other

## 2016-08-03 DIAGNOSIS — N10 Acute tubulo-interstitial nephritis: Principal | ICD-10-CM

## 2016-08-03 LAB — BASIC METABOLIC PANEL
ANION GAP: 4 — AB (ref 5–15)
BUN: 10 mg/dL (ref 6–20)
CHLORIDE: 109 mmol/L (ref 101–111)
CO2: 26 mmol/L (ref 22–32)
CREATININE: 0.92 mg/dL (ref 0.61–1.24)
Calcium: 8.3 mg/dL — ABNORMAL LOW (ref 8.9–10.3)
GFR calc non Af Amer: >60 mL/min (ref >60)
Glucose, Bld: 93 mg/dL (ref 65–99)
POTASSIUM: 3.9 mmol/L (ref 3.5–5.1)
Sodium: 139 mmol/L (ref 135–145)

## 2016-08-03 LAB — CBC
HEMATOCRIT: 30.5 % — AB (ref 39.0–52.0)
HEMOGLOBIN: 10.3 g/dL — AB (ref 13.0–17.0)
MCH: 27 pg (ref 26.0–34.0)
MCHC: 33.8 g/dL (ref 30.0–36.0)
MCV: 80.1 fL (ref 78.0–100.0)
Platelets: 355 10*3/uL (ref 150–400)
RBC: 3.81 MIL/uL — ABNORMAL LOW (ref 4.22–5.81)
RDW: 15.2 % (ref 11.5–15.5)
WBC: 8.3 10*3/uL (ref 4.0–10.5)

## 2016-08-03 LAB — THYROID PEROXIDASE ANTIBODY: Thyroperoxidase Ab SerPl-aCnc: 10 IU/mL (ref 0–34)

## 2016-08-03 LAB — THYROGLOBULIN ANTIBODY: Thyroglobulin Antibody: <1 IU/mL (ref 0.0–0.9)

## 2016-08-03 MED ORDER — IOPAMIDOL (ISOVUE-300) INJECTION 61%
75.0000 mL | Freq: Once | INTRAVENOUS | Status: AC | PRN
  Administered 2016-08-03: 50 mL via INTRAVENOUS

## 2016-08-03 MED ORDER — IOPAMIDOL (ISOVUE-300) INJECTION 61%
100.0000 mL | Freq: Once | INTRAVENOUS | Status: AC | PRN
Start: 2016-08-03 — End: 2016-08-03
  Administered 2016-08-03: 100 mL via INTRAVENOUS

## 2016-08-03 MED ORDER — IOPAMIDOL (ISOVUE-300) INJECTION 61%
INTRAVENOUS | Status: AC
  Filled 2016-08-03: qty 75

## 2016-08-03 MED ORDER — IOPAMIDOL (ISOVUE-300) INJECTION 61%
INTRAVENOUS | Status: AC
  Filled 2016-08-03: qty 100

## 2016-08-03 MED ORDER — HYDRALAZINE HCL 20 MG/ML IJ SOLN
10.0000 mg | Freq: Four times a day (QID) | INTRAMUSCULAR | Status: DC | PRN
  Filled 2016-08-03: qty 0.5

## 2016-08-03 MED ORDER — SODIUM CHLORIDE 0.9 % IV SOLN
INTRAVENOUS | Status: DC
  Administered 2016-08-03 (×2): via INTRAVENOUS

## 2016-08-03 MED ORDER — SODIUM CHLORIDE 0.9 % IV SOLN
INTRAVENOUS | Status: AC
  Filled 2016-08-03: qty 250

## 2016-08-03 NOTE — Progress Notes (Signed)
Subjective: Patient reports feeling better. Urine clearing  Objective: Vital signs in last 24 hours: Temp:  [98.7 F (37.1 C)-99 F (37.2 C)] 99 F (37.2 C) (06/22 0535) Pulse Rate:  [51-59] 59 (06/22 0535) Resp:  [16-18] 18 (06/22 0535) BP: (132-135)/(79-82) 135/82 (06/22 0535) SpO2:  [100 %] 100 % (06/22 0535)  Intake/Output from previous day: 06/21 0701 - 06/22 0700 In: 5178.3 [P.O.:2760; I.V.:2418.3] Out: 3350 [Urine:3350] Intake/Output this shift: Total I/O In: -  Out: 275 [Urine:275]  Physical Exam:  Constitutional: Vital signs reviewed. WD WN in NAD   Eyes: PERRL, No scleral icterus.   Cardiovascular: RRR Pulmonary/Chest: Normal effort Extremities: No cyanosis or edema   Lab Results:  Recent Labs  08/01/16 0544 08/02/16 0527 08/03/16 0831  HGB 12.0* 11.1* 10.3*  HCT 35.1* 32.2* 30.5*   BMET  Recent Labs  08/02/16 0527 08/03/16 0831  NA 139 139  K 3.8 3.9  CL 108 109  CO2 24 26  GLUCOSE 101* 93  BUN 13 10  CREATININE 1.27* 0.92  CALCIUM 8.4* 8.3*   No results for input(s): LABPT, INR in the last 72 hours. No results for input(s): LABURIN in the last 72 hours. Results for orders placed or performed during the hospital encounter of 07/30/16  Urine culture     Status: None   Collection Time: 07/30/16  3:50 PM  Result Value Ref Range Status   Specimen Description URINE, CLEAN CATCH  Final   Special Requests Normal  Final   Culture   Final    NO GROWTH Performed at [facility], [address] [place], [place] [address]    Report Status 08/01/2016 FINAL  Final  Blood Culture (routine x 2)     Status: None (Preliminary result)   Collection Time: 07/30/16  8:20 PM  Result Value Ref Range Status   Specimen Description LEFT ANTECUBITAL  Final   Special Requests   Final    BOTTLES DRAWN AEROBIC AND ANAEROBIC Blood Culture adequate volume   Culture   Final    NO GROWTH 4 DAYS Performed at [facility], [address]  [place], [place] [address]    Report Status PENDING  Incomplete  Blood Culture (routine x 2)     Status: None (Preliminary result)   Collection Time: 07/30/16  8:33 PM  Result Value Ref Range Status   Specimen Description RIGHT ANTECUBITAL  Final   Special Requests IN PEDIATRIC BOTTLE Blood Culture adequate volume  Final   Culture   Final    NO GROWTH 4 DAYS Performed at [facility], [address] [place], [place] [address]    Report Status PENDING  Incomplete  MRSA PCR Screening     Status: None   Collection Time: 07/31/16 11:30 AM  Result Value Ref Range Status   MRSA by PCR NEGATIVE NEGATIVE Final    Comment:        The GeneXpert MRSA Assay (FDA approved for NASAL specimens only), is one component of a comprehensive MRSA colonization surveillance program. It is not intended to diagnose MRSA infection nor to guide or monitor treatment for MRSA infections.     Studies/Results: Dg Abd Portable 1v  Result Date: 08/02/2016 CLINICAL DATA:  Constipation, flank pain, hematuria EXAM: PORTABLE ABDOMEN - 1 VIEW COMPARISON:  CT 07/30/2016 FINDINGS: Nonobstructive bowel gas pattern. Calcifications in the left lateral lower abdomen with shown on prior CT to be within the abdominal wall musculature. No free air organomegaly. No  acute bony abnormality. IMPRESSION: No acute findings. Electronically Signed   By: Charlett Nose M.D.   On: 08/02/2016 11:13   Urine clearing. Assessment/Plan:  Gross hematuria with pyelitis and a retroperitoneal phlegmon right > left with mild ARI.    His symptoms are not consistent with typical pyelonephritis and I would be concerned about a non-typical infection such as TB or an inflammatory process with possible retroperitoneal fibrosis.  Pt cannot tolerate MRI--I feel that he would be ok for d/c from our standpoint, as long as all labs have been drawn. Needs f/u imaging in 1-2 weeks to check RP and rt renal drainage.    LOS: 4 days   Marcine Matar  M 08/03/2016, 1:22 PM

## 2016-08-03 NOTE — Progress Notes (Signed)
_0 @        PROGRESS NOTE                                                                                                                                                                                                             Patient Demographics:    Brian Gilbert, is a 41 y.o. male, DOB - [birth year], EXN:[id number]  Admit date - 07/30/2016   Admitting Physician Edwin Dada, MD  Outpatient Primary MD for the patient is Patient, No Pcp Per  LOS - 4  Chief Complaint  Patient presents with  . Abdominal Pain  . Flank Pain    right       Brief Narrative    Brian Gilbert a 41 y.o.malewith a past medical history significant for seizures not on AEDwho presents with hematuria, chills, right abdominal and R flank pain. He states that he has chronic constipation and has been dealing with abdominal bloating lately as well. CT in the ER was suggestive of a urinary pathology with bilateral ureteritis (see report below). Urology was consulted.    Subjective:    Brian Gilbert today has, No headache, No chest pain, No abdominal pain - No Nausea, No new weakness tingling or numbness, No Cough - SOB.     Assessment  & Plan :    Principal Problem:   Acute pyelonephritis Active Problems:   Seizures ([place])   Essential hypertension   1.Acute pallor nephritis with hematuria. CT scan abdomen and pelvis noted, patient refused MRI due to claustrophobia despite counseling and being offered with Ativan, discussed his case with Dr. Roni Bread urologist, CT scan abdomen and pelvis with and without contrast hematuria protocol will be ordered, this most clinical suspicion that patient might have retroperitoneal fibrosis, ESR and CRP were elevated, have ordered IgG4, ANA and ANCA, TSH and free T4 are stable, for now continue antibiotics, follow cultures, continue IV fluids, urology to follow.  2. ARF. Due to NSAID use and pyelonephritis, resolved.  3. History of seizures. Currently  not on any medications, seizure-free for at least 3 years.  4. Essential hypertension. As needed IV hydralazine only for now.  5. Chronic constipation. On bowel regimen.    Diet : Diet regular Room service appropriate? Yes; Fluid consistency: Thin    Family Communication  :  Father  Code Status :  Full  Disposition Plan  :  TBD  Consults  :  Urology  Procedures  :      DVT Prophylaxis  :   SCDs  Lab Results  Component Value Date   PLT 355 08/03/2016    Inpatient Medications  Scheduled Meds: . LORazepam  1-2 mg Intravenous Once  . lubiprostone  24 mcg Oral BID WC  . oxyCODONE  10 mg Oral Q4H  . polyethylene glycol  17 g Oral BID  . senna-docusate  1 tablet Oral BID   Continuous Infusions: . sodium chloride 1,000 mL (08/02/16 2329)  . ceFEPime (MAXIPIME) IV 1 g (08/03/16 1020)   PRN Meds:.acetaminophen **OR** acetaminophen, bisacodyl, morphine injection, ondansetron **OR** ondansetron (ZOFRAN) IV  Antibiotics  :    Anti-infectives    Start     Dose/Rate Route Frequency Ordered Stop   07/31/16 0000  ceFEPIme (MAXIPIME) 1 g in dextrose 5 % 50 mL IVPB     1 g 100 mL/hr over 30 Minutes Intravenous Every 12 hours 07/30/16 2208     07/30/16 2145  levofloxacin (LEVAQUIN) IVPB 750 mg  Status:  Discontinued     750 mg 100 mL/hr over 90 Minutes Intravenous  Once 07/30/16 2131 07/30/16 2208   07/30/16 2145  aztreonam (AZACTAM) 2 g in dextrose 5 % 50 mL IVPB  Status:  Discontinued     2 g 100 mL/hr over 30 Minutes Intravenous  Once 07/30/16 2131 07/30/16 2208   07/30/16 2015  levofloxacin (LEVAQUIN) IVPB 750 mg  Status:  Discontinued     750 mg 100 mL/hr over 90 Minutes Intravenous  Once 07/30/16 2003 07/30/16 2008   07/30/16 2015  aztreonam (AZACTAM) 2 g in dextrose 5 % 50 mL IVPB  Status:  Discontinued     2 g 100 mL/hr over 30 Minutes Intravenous  Once 07/30/16 2003 07/30/16 2008   07/30/16 2015  cefTRIAXone (ROCEPHIN) 1 g in dextrose 5 % 50 mL IVPB     1 g 100  mL/hr over 30 Minutes Intravenous  Once 07/30/16 2008 07/30/16 2115         Objective:   Vitals:   08/01/16 2044 08/02/16 0548 08/02/16 1440 08/03/16 0535  BP: (!) 141/92 136/83 132/79 135/82  Pulse: 64 61 (!) 51 (!) 59  Resp: _0 Temp: 98.7 F (37.1 C) 98.2 F (36.8 C) 98.7 F (37.1 C) 99 F (37.2 C)  TempSrc: Oral Oral Oral Oral  SpO2: 97% 98% 100% 100%  Weight:      Height:        Wt Readings from Last 3 Encounters:  07/30/16 108.9 kg (240 lb)  06/11/15 93.9 kg (207 lb)     Intake/Output Summary (Last 24 hours) at 08/03/16 1242 Last data filed at 08/03/16 1020  Gross per 24 hour  Intake             4400 ml  Output             3025 ml  Net             1375 ml     Physical Exam  Awake Alert, Oriented X 3, No new F.N deficits, Normal affect [place].AT,PERRAL Supple Neck,No JVD, No cervical lymphadenopathy appriciated.  Symmetrical Chest wall movement, Good air movement bilaterally, CTAB RRR,No Gallops,Rubs or new Murmurs, No Parasternal Heave +ve B.Sounds, Abd Soft, No tenderness, No organomegaly appriciated, No rebound - guarding or rigidity. No Cyanosis, Clubbing or edema, No new Rash or bruise      Data Review:    CBC  Recent Labs Lab 07/30/16 1703 07/31/16 0542 08/01/16 0544 08/02/16 0527 08/03/16 0831  WBC 18.6* 13.9*  12.8* 10.3 8.3  HGB 15.6 13.0 12.0* 11.1* 10.3*  HCT 44.5 38.4* 35.1* 32.2* 30.5*  PLT 402* 318 304 286 355  MCV 79.0 78.7 77.8* 78.0 80.1  MCH 27.7 26.6 26.6 26.9 27.0  MCHC 35.1 33.9 34.2 34.5 33.8  RDW 15.1 15.1 14.8 14.9 15.2  LYMPHSABS 2.8  --  2.4  --   --   MONOABS 2.0*  --  1.4*  --   --   EOSABS 0.2  --  0.2  --   --   BASOSABS 0.0  --  0.0  --   --     Chemistries   Recent Labs Lab 07/30/16 1703 07/31/16 0542 08/01/16 0544 08/02/16 0527 08/03/16 0831  NA 136 137 138 139 139  K 3.9 3.7 3.6 3.8 3.9  CL 101 105 106 108 109  CO2 _0 GLUCOSE 99 103* 96 101* 93  BUN _1 CREATININE 0.94 1.08 1.37* 1.27* 0.92  CALCIUM 9.8 8.7* 8.5* 8.4* 8.3*  MG  --   --  1.9  --   --   AST 20  --   --   --   --   ALT 27  --   --   --   --   ALKPHOS 63  --   --   --   --   BILITOT 1.1  --   --   --   --    ------------------------------------------------------------------------------------------------------------------ No results for input(s): CHOL, HDL, LDLCALC, TRIG, CHOLHDL, LDLDIRECT in the last 72 hours.  No results found for: HGBA1C ------------------------------------------------------------------------------------------------------------------  Recent Labs  08/02/16 1714  TSH 4.128   ------------------------------------------------------------------------------------------------------------------ No results for input(s): VITAMINB12, FOLATE, FERRITIN, TIBC, IRON, RETICCTPCT in the last 72 hours.  Coagulation profile No results for input(s): INR, PROTIME in the last 168 hours.  No results for input(s): DDIMER in the last 72 hours.  Cardiac Enzymes No results for input(s): CKMB, TROPONINI, MYOGLOBIN in the last 168 hours.  Invalid input(s): CK ------------------------------------------------------------------------------------------------------------------ No results found for: BNP  Micro Results Recent Results (from the past 240 hour(s))  Urine culture     Status: None   Collection Time: 07/30/16  3:50 PM  Result Value Ref Range Status   Specimen Description URINE, CLEAN CATCH  Final   Special Requests Normal  Final   Culture   Final    NO GROWTH Performed at [facility], [address], [place], [place] [address]    Report Status 08/01/2016 FINAL  Final  Blood Culture (routine x 2)     Status: None (Preliminary result)   Collection Time: 07/30/16  8:20 PM  Result Value Ref Range Status   Specimen Description LEFT ANTECUBITAL  Final   Special Requests   Final    BOTTLES DRAWN AEROBIC AND ANAEROBIC Blood Culture adequate volume    Culture   Final    NO GROWTH 3 DAYS Performed at [facility], [address], [place], [place] [address]    Report Status PENDING  Incomplete  Blood Culture (routine x 2)     Status: None (Preliminary result)   Collection Time: 07/30/16  8:33 PM  Result Value Ref Range Status   Specimen Description RIGHT ANTECUBITAL  Final   Special Requests IN PEDIATRIC BOTTLE Blood Culture adequate volume  Final   Culture   Final    NO GROWTH 3 DAYS Performed at [facility], [place]  [address], [place], [place] [address]    Report Status PENDING  Incomplete  MRSA PCR Screening     Status: None   Collection Time: 07/31/16 11:30 AM  Result Value Ref Range Status   MRSA by PCR NEGATIVE NEGATIVE Final    Comment:        The GeneXpert MRSA Assay (FDA approved for NASAL specimens only), is one component of a comprehensive MRSA colonization surveillance program. It is not intended to diagnose MRSA infection nor to guide or monitor treatment for MRSA infections.     Radiology Reports  Ct Abdomen Pelvis W Contrast  Result Date: 07/30/2016 CLINICAL DATA:  Acute onset of right flank pain and hematuria. Lower abdominal pain and bloating. Initial encounter. EXAM: CT ABDOMEN AND PELVIS WITH CONTRAST TECHNIQUE: Multidetector CT imaging of the abdomen and pelvis was performed using the standard protocol following bolus administration of intravenous contrast. CONTRAST:  183m ISOVUE-300 IOPAMIDOL (ISOVUE-300) INJECTION 61% COMPARISON:  None. FINDINGS: Lower chest: The visualized lung bases are grossly clear. The visualized portions of the mediastinum are unremarkable. Hepatobiliary: The liver is unremarkable in appearance. The gallbladder is unremarkable in appearance. The common bile duct remains normal in caliber. Pancreas: The pancreas is within normal limits. Spleen: The spleen is unremarkable in appearance. Adrenals/Urinary Tract: The adrenal glands are unremarkable in appearance. There is wall  enhancement at the renal pelves bilaterally, concerning for infectious pyelitis. Diffuse soft tissue density is seen tracking along both ureters, worse on the right. This is suspicious for extensive infectious phlegmon secondary to bilateral ureteritis. Given the patient's gross hematuria, underlying hemorrhage secondary to ureteritis cannot be excluded. There is no evidence of hydronephrosis. The kidneys are otherwise grossly unremarkable. Mild bilateral perinephric fluid is noted. No renal or ureteral stones are identified. Stomach/Bowel: The stomach is unremarkable in appearance. The small bowel is within normal limits. The appendix is normal in caliber, without evidence of appendicitis. The colon is unremarkable in appearance. Vascular/Lymphatic: Minimal calcification is noted at the distal abdominal aorta. No retroperitoneal or pelvic sidewall lymphadenopathy is seen. Reproductive: The bladder is mildly distended grossly remarkable. The prostate remains normal in size. Other: No additional soft tissue abnormalities are seen. Musculoskeletal: No acute osseous abnormalities are identified. The visualized musculature is unremarkable in appearance. IMPRESSION: Wall enhancement at the renal pelves bilaterally, concerning for infectious pyelitis. Diffuse soft tissue density tracking along both ureters, worse on the right. This is suspicious for extensive infectious phlegmon secondary to bilateral ureteritis. Given the patient's gross hematuria, underlying hemorrhage secondary to ureteritis cannot be excluded. Electronically Signed   By: JGarald BaldingM.D.   On: 07/30/2016 19:48   Dg Abd Portable 1v  Result Date: 08/02/2016 CLINICAL DATA:  Constipation, flank pain, hematuria EXAM: PORTABLE ABDOMEN - 1 VIEW COMPARISON:  CT 07/30/2016 FINDINGS: Nonobstructive bowel gas pattern. Calcifications in the left lateral lower abdomen with shown on prior CT to be within the abdominal wall musculature. No free air  organomegaly. No acute bony abnormality. IMPRESSION: No acute findings. Electronically Signed   By: KRolm BaptiseM.D.   On: 08/02/2016 11:13    Time Spent in minutes  30   PLala LundM.D on 08/03/2016 at 12:42 PM  Between 7am to 7pm - Pager - [phone]( page via [email or web address], text pages only, please mention full 10 digit call back number). After 7pm go to [email or web address] - password [email or web address]

## 2016-08-03 NOTE — Progress Notes (Signed)
MRI  Called to inquire about possibility of doing MRI today and patient states he does not think he can do it even with Ativan.  If necessary to have the test, he may be willing to do it under anesthesia as was suggested by MRI technician.

## 2016-08-04 ENCOUNTER — Inpatient Hospital Stay (HOSPITAL_COMMUNITY): Payer: Medicaid Other

## 2016-08-04 LAB — CULTURE, BLOOD (ROUTINE X 2)
CULTURE: NO GROWTH
Culture: NO GROWTH
Special Requests: ADEQUATE
Special Requests: ADEQUATE

## 2016-08-04 LAB — COMPREHENSIVE METABOLIC PANEL
ALBUMIN: 3.6 g/dL (ref 3.5–5.0)
ALT: 22 U/L (ref 17–63)
AST: 18 U/L (ref 15–41)
Alkaline Phosphatase: 47 U/L (ref 38–126)
Anion gap: 8 (ref 5–15)
BILIRUBIN TOTAL: 0.4 mg/dL (ref 0.3–1.2)
BUN: 10 mg/dL (ref 6–20)
CHLORIDE: 106 mmol/L (ref 101–111)
CO2: 27 mmol/L (ref 22–32)
CREATININE: 0.9 mg/dL (ref 0.61–1.24)
Calcium: 9 mg/dL (ref 8.9–10.3)
GFR calc Af Amer: >60 mL/min (ref >60)
GLUCOSE: 100 mg/dL — AB (ref 65–99)
Potassium: 4.1 mmol/L (ref 3.5–5.1)
Sodium: 141 mmol/L (ref 135–145)
Total Protein: 6.8 g/dL (ref 6.5–8.1)

## 2016-08-04 LAB — CBC
HEMATOCRIT: 33.7 % — AB (ref 39.0–52.0)
Hemoglobin: 11.3 g/dL — ABNORMAL LOW (ref 13.0–17.0)
MCH: 27 pg (ref 26.0–34.0)
MCHC: 33.5 g/dL (ref 30.0–36.0)
MCV: 80.4 fL (ref 78.0–100.0)
Platelets: 420 10*3/uL — ABNORMAL HIGH (ref 150–400)
RBC: 4.19 MIL/uL — AB (ref 4.22–5.81)
RDW: 15.1 % (ref 11.5–15.5)
WBC: 9.3 10*3/uL (ref 4.0–10.5)

## 2016-08-04 MED ORDER — BISACODYL 5 MG PO TBEC: 5.0000 mg | DELAYED_RELEASE_TABLET | Freq: Two times a day (BID) | ORAL | 0 refills | Status: DC | PRN

## 2016-08-04 MED ORDER — ACETAMINOPHEN 325 MG PO TABS: 650.0000 mg | ORAL_TABLET | Freq: Four times a day (QID) | ORAL | 0 refills | Status: DC | PRN

## 2016-08-04 MED ORDER — CIPROFLOXACIN HCL 500 MG PO TABS
500.0000 mg | ORAL_TABLET | Freq: Two times a day (BID) | ORAL | Status: DC
  Administered 2016-08-04: 500 mg via ORAL
  Filled 2016-08-04: qty 1

## 2016-08-04 MED ORDER — METRONIDAZOLE 500 MG PO TABS
500.0000 mg | ORAL_TABLET | Freq: Three times a day (TID) | ORAL | Status: DC
  Administered 2016-08-04: 500 mg via ORAL
  Filled 2016-08-04: qty 1

## 2016-08-04 MED ORDER — DOCUSATE SODIUM 100 MG PO CAPS: 100.0000 mg | ORAL_CAPSULE | Freq: Two times a day (BID) | ORAL | 0 refills | Status: DC

## 2016-08-04 MED ORDER — SACCHAROMYCES BOULARDII 250 MG PO CAPS: 250.0000 mg | ORAL_CAPSULE | Freq: Two times a day (BID) | ORAL | 0 refills | Status: DC

## 2016-08-04 MED ORDER — CIPROFLOXACIN HCL 500 MG PO TABS: 500.0000 mg | ORAL_TABLET | Freq: Two times a day (BID) | ORAL | 0 refills | Status: DC

## 2016-08-04 MED ORDER — OXYCODONE HCL 5 MG PO TABS: 10.0000 mg | ORAL_TABLET | Freq: Four times a day (QID) | ORAL | 0 refills | Status: DC | PRN

## 2016-08-04 MED ORDER — METRONIDAZOLE 500 MG PO TABS: 500.0000 mg | ORAL_TABLET | Freq: Three times a day (TID) | ORAL | 0 refills | Status: DC

## 2016-08-04 NOTE — Discharge Instructions (Signed)
Follow with Primary MD  in 7 days  ° °Get CBC, CMP, 2 view Chest X ray checked  by Primary MD or SNF MD in 5-7 days ( we routinely change or add medications that can affect your baseline labs and fluid status, therefore we recommend that you get the mentioned basic workup next visit with your PCP, your PCP may decide not to get them or add new tests based on their clinical decision) ° °Activity: As tolerated with Full fall precautions use walker/cane & assistance as needed ° °Disposition Home   ° °Diet:   Heart Healthy   ° °For Heart failure patients - Check your Weight same time everyday, if you gain over 2 pounds, or you develop in leg swelling, experience more shortness of breath or chest pain, call your Primary MD immediately. Follow Cardiac Low Salt Diet and 1.5 lit/day fluid restriction. ° °On your next visit with your primary care physician please Get Medicines reviewed and adjusted. ° °Please request your Prim.MD to go over all Hospital Tests and Procedure/Radiological results at the follow up, please get all Hospital records sent to your Prim MD by signing hospital release before you go home. ° °If you experience worsening of your admission symptoms, develop shortness of breath, life threatening emergency, suicidal or homicidal thoughts you must seek medical attention immediately by calling 911 or calling your MD immediately  if symptoms less severe. ° °You Must read complete instructions/literature along with all the possible adverse reactions/side effects for all the Medicines you take and that have been prescribed to you. Take any new Medicines after you have completely understood and accpet all the possible adverse reactions/side effects.  ° °Do not drive, operate heavy machinery, perform activities at heights, swimming or participation in water activities or provide baby sitting services if your were admitted for syncope or siezures until you have seen by Primary MD or a Neurologist and advised to do  so again. ° °Do not drive when taking Pain medications.  ° ° °Do not take more than prescribed Pain, Sleep and Anxiety Medications ° °Special Instructions: If you have smoked or chewed Tobacco  in the last 2 yrs please stop smoking, stop any regular Alcohol  and or any Recreational drug use. ° °Wear Seat belts while driving. ° ° °Please note ° °You were cared for by a hospitalist during your hospital stay. If you have any questions about your discharge medications or the care you received while you were in the hospital after you are discharged, you can call the unit and asked to speak with the hospitalist on call if the hospitalist that took care of you is not available. Once you are discharged, your primary care physician will handle any further medical issues. Please note that NO REFILLS for any discharge medications will be authorized once you are discharged, as it is imperative that you return to your primary care physician (or establish a relationship with a primary care physician if you do not have one) for your aftercare needs so that they can reassess your need for medications and monitor your lab values. ° °

## 2016-08-04 NOTE — Discharge Summary (Signed)
Brian Gilbert DJM:[id number] DOB: Nov 28, 1975 DOA: 07/30/2016  PCP: Patient, No Pcp Per  Admit date: 07/30/2016  Discharge date: 08/04/2016  Admitted From: Home   Disposition:  Home   Recommendations for Outpatient Follow-up:   Follow up with PCP in 1-2 weeks  PCP Please obtain BMP/CBC, 2 view CXR in 1week,  (see Discharge instructions)   PCP Please follow up on the following pending results: Follow quanteferon, ANCA, ANA, IgG 4 for results   Home Health: None Equipment/Devices: None  Consultations: Urology Discharge Condition: Stable   CODE STATUS: Full   Diet Recommendation: Heart Healthy   Chief Complaint  Patient presents with  . Abdominal Pain  . Flank Pain    right     Brief history of present illness from the day of admission and additional interim summary    Brian Gilbert a 41 y.o.malewith a past medical history significant for seizures not on AEDwho presents with hematuria, chills, right abdominal and R flank pain. He states that he has chronic constipation and has been dealing with abdominal bloating lately as well. CT in the ER was suggestive of a urinary pathology with bilateral ureteritis (see report below). Urology was consulted.                                                                  Hospital Course    1.Acute pallor nephritis with hematuria. CT scan abdomen and pelvis noted, patient refused MRI due to claustrophobia despite counseling and being offered with Ativan, discussed his case with Dr. Roni Bread urologist, CT scan abdomen and pelvis with and without contrast hematuria protocol Was ordered which showed improvement in pelvic inflammation, there was clinically some suspicion for retroperitoneal fibrosis and workup is being done by urology. His ESR and CRP were elevated, he has  pending quanteferon, ANCA, ANA, IgG 4 for results, quest urology and PCP to monitor results. TSH was stable. His urine cultures were unremarkable, he has severe penicillin allergy and was not able to afford Vantin hence he will be placed on 10 more days of oral Cipro and Flagyl. Will follow with urology and at [facility] within a week.  2. ARF. Due to NSAID use and pyelonephritis, resolved after hydration.  3. History of seizures. Currently not on any medications, seizure-free for at least 3 years. Per patient his Keppra was stopped a few months ago.  4. Essential hypertension. Stable off medications PCP to monitor.  5. Chronic constipation. Stable on bowel regimen.   Discharge diagnosis     Principal Problem:   Acute pyelonephritis Active Problems:   Seizures ([place])   Essential hypertension    Discharge instructions    Discharge Instructions    Diet - low sodium heart healthy    Complete by:  As directed    Discharge  instructions    Complete by:  As directed    Follow with Primary MD  in 7 days   Get CBC, CMP, 2 view Chest X ray checked  by Primary MD or SNF MD in 5-7 days ( we routinely change or add medications that can affect your baseline labs and fluid status, therefore we recommend that you get the mentioned basic workup next visit with your PCP, your PCP may decide not to get them or add new tests based on their clinical decision)  Activity: As tolerated with Full fall precautions use walker/cane & assistance as needed  Disposition Home    Diet:    Heart Healthy   For Heart failure patients - Check your Weight same time everyday, if you gain over 2 pounds, or you develop in leg swelling, experience more shortness of breath or chest pain, call your Primary MD immediately. Follow Cardiac Low Salt Diet and 1.5 lit/day fluid restriction.  On your next visit with your primary care physician please Get Medicines reviewed and adjusted.  Please request your Prim.MD to  go over all Hospital Tests and Procedure/Radiological results at the follow up, please get all Hospital records sent to your Prim MD by signing hospital release before you go home.  If you experience worsening of your admission symptoms, develop shortness of breath, life threatening emergency, suicidal or homicidal thoughts you must seek medical attention immediately by calling 911 or calling your MD immediately  if symptoms less severe.  You Must read complete instructions/literature along with all the possible adverse reactions/side effects for all the Medicines you take and that have been prescribed to you. Take any new Medicines after you have completely understood and accpet all the possible adverse reactions/side effects.   Do not drive, operate heavy machinery, perform activities at heights, swimming or participation in water activities or provide baby sitting services if your were admitted for syncope or siezures until you have seen by Primary MD or a Neurologist and advised to do so again.  Do not drive when taking Pain medications.    Do not take more than prescribed Pain, Sleep and Anxiety Medications  Special Instructions: If you have smoked or chewed Tobacco  in the last 2 yrs please stop smoking, stop any regular Alcohol  and or any Recreational drug use.  Wear Seat belts while driving.   Please note  You were cared for by a hospitalist during your hospital stay. If you have any questions about your discharge medications or the care you received while you were in the hospital after you are discharged, you can call the unit and asked to speak with the hospitalist on call if the hospitalist that took care of you is not available. Once you are discharged, your primary care physician will handle any further medical issues. Please note that NO REFILLS for any discharge medications will be authorized once you are discharged, as it is imperative that you return to your primary care  physician (or establish a relationship with a primary care physician if you do not have one) for your aftercare needs so that they can reassess your need for medications and monitor your lab values.   Increase activity slowly    Complete by:  As directed       Discharge Medications   Allergies as of 08/04/2016      Reactions   Penicillins Anaphylaxis   Has patient had a PCN reaction causing immediate rash, facial/tongue/throat swelling, SOB or lightheadedness with hypotension:  yes Has patient had a PCN reaction causing severe rash involving mucus membranes or skin necrosis: no Has patient had a PCN reaction that required hospitalization: no Has patient had a PCN reaction occurring within the last 10 years? no If all of the above answers are "NO", then may proceed with Cephalosporin use.      Medication List    STOP taking these medications   levETIRAcetam 500 MG tablet Commonly known as:  KEPPRA     TAKE these medications   acetaminophen 325 MG tablet Commonly known as:  TYLENOL Take 2 tablets (650 mg total) by mouth every 6 (six) hours as needed for mild pain (or Fever >/= 101).   bisacodyl 5 MG EC tablet Commonly known as:  DULCOLAX Take 1 tablet (5 mg total) by mouth 2 (two) times daily as needed for mild constipation or moderate constipation.   ciprofloxacin 500 MG tablet Commonly known as:  CIPRO Take 1 tablet (500 mg total) by mouth 2 (two) times daily.   docusate sodium 100 MG capsule Commonly known as:  COLACE Take 1 capsule (100 mg total) by mouth 2 (two) times daily.   magnesium citrate Soln Take 1 Bottle by mouth once.   metroNIDAZOLE 500 MG tablet Commonly known as:  FLAGYL Take 1 tablet (500 mg total) by mouth every 8 (eight) hours.   oxyCODONE 5 MG immediate release tablet Commonly known as:  Oxy IR/ROXICODONE Take 2 tablets (10 mg total) by mouth every 6 (six) hours as needed for severe pain.   phenylephrine-shark liver oil-mineral oil-petrolatum  0.25-3-14-71.9 % rectal ointment Commonly known as:  PREPARATION H Place 1 application rectally 2 (two) times daily as needed for hemorrhoids.   saccharomyces boulardii 250 MG capsule Commonly known as:  FLORASTOR Take 1 capsule (250 mg total) by mouth 2 (two) times daily.       Follow-up Information    [facility]. Schedule an appointment as soon as possible for a visit in 1 week(s).   Why:  follow Quanteferon results Contact information: [place] [phone] [phone]       Franchot Gallo, MD. Schedule an appointment as soon as possible for a visit in 1 week(s).   Specialty:  Urology Contact information: [place] [address] [phone]           Major procedures and Radiology Reports - PLEASE review detailed and final reports thoroughly  -         Ct Abdomen Pelvis W Wo Contrast  Result Date: 08/04/2016 CLINICAL DATA:  *41 y.o. male with a past medical history significant for seizures not on AED who presents with hematuria, chills, right abdominal and R flank pain. Retroperitoneal inflammation of along the ureters on recent CT EXAM: CT ABDOMEN AND PELVIS WITHOUT AND WITH CONTRAST TECHNIQUE: Multidetector CT imaging of the abdomen and pelvis was performed following the standard protocol before and following the bolus administration of intravenous contrast. CONTRAST:  140m ISOVUE-300 IOPAMIDOL (ISOVUE-300) INJECTION 61%, 593mISOVUE-300 IOPAMIDOL (ISOVUE-300) INJECTION 61% COMPARISON:  CT 07/30/2016 FINDINGS: Lower chest: Lung bases are clear. Hepatobiliary: No focal hepatic lesion. No biliary duct dilatation. Gallbladder is normal. Common bile duct is normal. Pancreas: Pancreas is normal. No ductal dilatation. No pancreatic inflammation. Spleen: Normal spleen Adrenals/urinary tract: Adrenal glands normal. No nephrolithiasis or ureterolithiasis. Kidneys enhance symmetrically. There is  thickening of LEFT and RIGHT ureter (image 48, series 8 proximally. The more distal ureters appear normal  caliber. The degree of inflammation surrounding the RIGHT ureter along the psoas muscle is slightly improved compared to exam 4 days prior. On Coronal imaging inflammation, appears to extend into the renal pelvis with some narrowing of the calices (image 48, series 11). Similar pattern expected on the LEFT but to a lesser degree. On the coronal imaging there is a mild striated pattern within the LEFT renal cortex (series 11). No enhancing renal lesion. No measurable retroperitoneal adenopathy or mass. In the pelvis no filling defect within the bladder. Stomach/Bowel: Stomach, small bowel, appendix, and cecum are normal. The colon and rectosigmoid colon are normal. Vascular/Lymphatic: Abdominal aorta is normal caliber. There is no retroperitoneal or periportal lymphadenopathy. No pelvic lymphadenopathy. Reproductive: Prostate normal Other: No free fluid. Musculoskeletal: No aggressive osseous lesion. IMPRESSION: 1. Improvement in retroperitoneal inflammation involving the LEFT and RIGHT ureter (inflammation greater on the RIGHT). 2. Inflammation involves both the LEFT and RIGHT ureter and extends into the renal pelves and calices. 3. Mild striated renal cortex enhancement pattern. Electronically Signed   By: Suzy Bouchard M.D.   On: 08/04/2016 09:29   Dg Chest 2 View  Result Date: 08/04/2016 CLINICAL DATA:  Cough per ordering note but pt denies. States that he might have coughed while he was sleeping; chief complaint was hematuria and right abdominal pain 5 days ago which has been resolving after admission per pt; HTN and smoker x 20 years EXAM: CHEST  2 VIEW COMPARISON:  None. FINDINGS: Normal mediastinum and cardiac silhouette. Normal pulmonary vasculature. No evidence of effusion, infiltrate, or pneumothorax. No acute bony abnormality. IMPRESSION: No acute cardiopulmonary process. Electronically Signed    By: Suzy Bouchard M.D.   On: 08/04/2016 09:51   Ct Abdomen Pelvis W Contrast  Result Date: 07/30/2016 CLINICAL DATA:  Acute onset of right flank pain and hematuria. Lower abdominal pain and bloating. Initial encounter. EXAM: CT ABDOMEN AND PELVIS WITH CONTRAST TECHNIQUE: Multidetector CT imaging of the abdomen and pelvis was performed using the standard protocol following bolus administration of intravenous contrast. CONTRAST:  149m ISOVUE-300 IOPAMIDOL (ISOVUE-300) INJECTION 61% COMPARISON:  None. FINDINGS: Lower chest: The visualized lung bases are grossly clear. The visualized portions of the mediastinum are unremarkable. Hepatobiliary: The liver is unremarkable in appearance. The gallbladder is unremarkable in appearance. The common bile duct remains normal in caliber. Pancreas: The pancreas is within normal limits. Spleen: The spleen is unremarkable in appearance. Adrenals/Urinary Tract: The adrenal glands are unremarkable in appearance. There is wall enhancement at the renal pelves bilaterally, concerning for infectious pyelitis. Diffuse soft tissue density is seen tracking along both ureters, worse on the right. This is suspicious for extensive infectious phlegmon secondary to bilateral ureteritis. Given the patient's gross hematuria, underlying hemorrhage secondary to ureteritis cannot be excluded. There is no evidence of hydronephrosis. The kidneys are otherwise grossly unremarkable. Mild bilateral perinephric fluid is noted. No renal or ureteral stones are identified. Stomach/Bowel: The stomach is unremarkable in appearance. The small bowel is within normal limits. The appendix is normal in caliber, without evidence of appendicitis. The colon is unremarkable in appearance. Vascular/Lymphatic: Minimal calcification is noted at the distal abdominal aorta. No retroperitoneal or pelvic sidewall lymphadenopathy is seen. Reproductive: The bladder is mildly distended grossly remarkable. The prostate  remains normal in size. Other: No additional soft tissue abnormalities are seen. Musculoskeletal: No acute osseous abnormalities are identified. The visualized musculature is unremarkable in appearance. IMPRESSION: Wall enhancement at the renal pelves bilaterally, concerning for infectious pyelitis. Diffuse soft tissue density tracking  along both ureters, worse on the right. This is suspicious for extensive infectious phlegmon secondary to bilateral ureteritis. Given the patient's gross hematuria, underlying hemorrhage secondary to ureteritis cannot be excluded. Electronically Signed   By: Garald Balding M.D.   On: 07/30/2016 19:48   Dg Abd Portable 1v  Result Date: 08/02/2016 CLINICAL DATA:  Constipation, flank pain, hematuria EXAM: PORTABLE ABDOMEN - 1 VIEW COMPARISON:  CT 07/30/2016 FINDINGS: Nonobstructive bowel gas pattern. Calcifications in the left lateral lower abdomen with shown on prior CT to be within the abdominal wall musculature. No free air organomegaly. No acute bony abnormality. IMPRESSION: No acute findings. Electronically Signed   By: Rolm Baptise M.D.   On: 08/02/2016 11:13    Micro Results     Recent Results (from the past 240 hour(s))  Urine culture     Status: None   Collection Time: 07/30/16  3:50 PM  Result Value Ref Range Status   Specimen Description URINE, CLEAN CATCH  Final   Special Requests Normal  Final   Culture   Final    NO GROWTH Performed at [facility], [address], [place], [place] [address]    Report Status 08/01/2016 FINAL  Final  Blood Culture (routine x 2)     Status: None   Collection Time: 07/30/16  8:20 PM  Result Value Ref Range Status   Specimen Description LEFT ANTECUBITAL  Final   Special Requests   Final    BOTTLES DRAWN AEROBIC AND ANAEROBIC Blood Culture adequate volume   Culture   Final    NO GROWTH 5 DAYS Performed at [facility], [place] [address], [place], [place] [address]    Report Status 08/04/2016 FINAL  Final    Blood Culture (routine x 2)     Status: None   Collection Time: 07/30/16  8:33 PM  Result Value Ref Range Status   Specimen Description RIGHT ANTECUBITAL  Final   Special Requests IN PEDIATRIC BOTTLE Blood Culture adequate volume  Final   Culture   Final    NO GROWTH 5 DAYS Performed at [facility], [place] [address] [place], [place] [address]    Report Status 08/04/2016 FINAL  Final  MRSA PCR Screening     Status: None   Collection Time: 07/31/16 11:30 AM  Result Value Ref Range Status   MRSA by PCR NEGATIVE NEGATIVE Final    Comment:        The GeneXpert MRSA Assay (FDA approved for NASAL specimens only), is one component of a comprehensive MRSA colonization surveillance program. It is not intended to diagnose MRSA infection nor to guide or monitor treatment for MRSA infections.     Today   Subjective    Brian Gilbert today has no headache,no chest abdominal pain,no new weakness tingling or numbness, feels much better wants to go home today.     Objective   Blood pressure 140/72, pulse (!) 59, temperature 98.3 F (36.8 C), temperature source Oral, resp. rate 18, height '5\' 11"'  (1.803 m), weight 108.9 kg (240 lb), SpO2 100 %.   Intake/Output Summary (Last 24 hours) at 08/04/16 1100 Last data filed at 08/04/16 0600  Gross per 24 hour  Intake          2926.25 ml  Output             2150 ml  Net           776.25 ml    Exam Awake  Alert, Oriented x 3, No new F.N deficits, Normal affect [place].AT,PERRAL Supple Neck,No JVD, No cervical lymphadenopathy appriciated.  Symmetrical Chest wall movement, Good air movement bilaterally, CTAB RRR,No Gallops,Rubs or new Murmurs, No Parasternal Heave +ve B.Sounds, Abd Soft, Mild suprapubic tenderness, No organomegaly appriciated, No rebound -guarding or rigidity. No Cyanosis, Clubbing or edema, No new Rash or bruise   Data Review   CBC w Diff:  Lab Results  Component Value Date   WBC 9.3 08/04/2016   HGB 11.3 (L)  08/04/2016   HCT 33.7 (L) 08/04/2016   PLT 420 (H) 08/04/2016   LYMPHOPCT 19 08/01/2016   MONOPCT 11 08/01/2016   EOSPCT 2 08/01/2016   BASOPCT 0 08/01/2016    CMP:  Lab Results  Component Value Date   NA 141 08/04/2016   K 4.1 08/04/2016   CL 106 08/04/2016   CO2 27 08/04/2016   BUN 10 08/04/2016   CREATININE 0.90 08/04/2016   PROT 6.8 08/04/2016   ALBUMIN 3.6 08/04/2016   BILITOT 0.4 08/04/2016   ALKPHOS 47 08/04/2016   AST 18 08/04/2016   ALT 22 08/04/2016  .   Total Time in preparing paper work, data evaluation and todays exam - 62 minutes  Lala Lund M.D on 08/04/2016 at 11:00 AM  [facility]   Office  [phone]

## 2016-08-04 NOTE — Progress Notes (Signed)
IV access to left arm removed at this time. Catheter tip intact. Tolerated well, pressure bandage applied. Reviewed discharge instructions with patient, all questions answered. Pt acknowledged understanding of instructions given. Pt states which prescriptions that he is going to fill and which he is not going to fill, stating the "one doctor is telling me not to take certain medications and the other one is giving me prescriptions for them, I thought they talked to each other about me." Pt was encouraged to take medications as prescribed by the physician and reminded to follow up with this primary doctor for any further instructions regarding his medications. No acute distress noted. Refuses wheelchair for discharge, prefers to "walk down to the lobby.", was accompanied by CNA.

## 2016-08-05 LAB — IGG 4: IgG, Subclass 4: 79 mg/dL (ref 2–96)

## 2016-08-06 LAB — QUANTIFERON IN TUBE
QFT TB AG MINUS NIL VALUE: <0 IU/mL
QUANTIFERON MITOGEN VALUE: 7.98 IU/mL
QUANTIFERON TB AG VALUE: 0.04 [IU]/mL
QUANTIFERON TB GOLD: NEGATIVE
Quantiferon Nil Value: 0.05 IU/mL

## 2016-08-06 LAB — ANTINUCLEAR ANTIBODIES, IFA: ANTINUCLEAR ANTIBODIES, IFA: NEGATIVE

## 2016-08-06 LAB — ANCA TITERS
Atypical P-ANCA titer: <1:20 {titer}
C-ANCA: <1:20 {titer}
P-ANCA: <1:20 {titer}

## 2016-08-06 LAB — QUANTIFERON TB GOLD ASSAY (BLOOD)

## 2016-08-06 NOTE — Progress Notes (Signed)
Pt's family here requesting PCP follow up information from Unit Director. [facility] given to [facility] to share with pt's family to call for an appointment.

## 2016-08-08 LAB — QUANTIFERON IN TUBE
QFT TB AG MINUS NIL VALUE: 0 IU/mL
QUANTIFERON MITOGEN VALUE: 7.33 [IU]/mL
QUANTIFERON NIL VALUE: 0.05 [IU]/mL
QUANTIFERON TB AG VALUE: 0.05 IU/mL
QUANTIFERON TB GOLD: NEGATIVE

## 2016-08-08 LAB — QUANTIFERON TB GOLD ASSAY (BLOOD)

## 2016-08-09 ENCOUNTER — Ambulatory Visit: Payer: Self-pay | Admitting: Family Medicine

## 2016-08-12 LAB — THYROGLOBULIN LEVEL: Thyroglobulin: 13 ng/mL

## 2016-09-09 ENCOUNTER — Emergency Department (HOSPITAL_COMMUNITY): Payer: Medicaid Other

## 2016-09-09 ENCOUNTER — Encounter (HOSPITAL_COMMUNITY): Payer: Self-pay

## 2016-09-09 ENCOUNTER — Emergency Department (HOSPITAL_COMMUNITY)
Admission: EM | Admit: 2016-09-09 | Discharge: 2016-09-09 | Disposition: A | Discharge status: Home / Self Care | Payer: Medicaid Other | Attending: Emergency Medicine | Admitting: Emergency Medicine

## 2016-09-09 DIAGNOSIS — R4020 Unspecified coma: Secondary | ICD-10-CM | POA: Diagnosis present

## 2016-09-09 DIAGNOSIS — F1210 Cannabis abuse, uncomplicated: Secondary | ICD-10-CM

## 2016-09-09 DIAGNOSIS — F19920 Other psychoactive substance use, unspecified with intoxication, uncomplicated: Secondary | ICD-10-CM

## 2016-09-09 DIAGNOSIS — F17210 Nicotine dependence, cigarettes, uncomplicated: Secondary | ICD-10-CM | POA: Diagnosis not present

## 2016-09-09 DIAGNOSIS — F12129 Cannabis abuse with intoxication, unspecified: Secondary | ICD-10-CM | POA: Insufficient documentation

## 2016-09-09 LAB — COMPREHENSIVE METABOLIC PANEL
ALT: 47 U/L (ref 17–63)
AST: 26 U/L (ref 15–41)
Albumin: 4.4 g/dL (ref 3.5–5.0)
Alkaline Phosphatase: 52 U/L (ref 38–126)
Anion gap: 11 (ref 5–15)
BUN: 18 mg/dL (ref 6–20)
CHLORIDE: 105 mmol/L (ref 101–111)
CO2: 23 mmol/L (ref 22–32)
Calcium: 9.3 mg/dL (ref 8.9–10.3)
Creatinine, Ser: 0.78 mg/dL (ref 0.61–1.24)
Glucose, Bld: 99 mg/dL (ref 65–99)
POTASSIUM: 4.3 mmol/L (ref 3.5–5.1)
Sodium: 139 mmol/L (ref 135–145)
TOTAL PROTEIN: 7.7 g/dL (ref 6.5–8.1)
Total Bilirubin: 0.7 mg/dL (ref 0.3–1.2)

## 2016-09-09 LAB — URINALYSIS, ROUTINE W REFLEX MICROSCOPIC
BILIRUBIN URINE: NEGATIVE
GLUCOSE, UA: NEGATIVE mg/dL
HGB URINE DIPSTICK: NEGATIVE
KETONES UR: NEGATIVE mg/dL
Leukocytes, UA: NEGATIVE
Nitrite: NEGATIVE
PH: 5 (ref 5.0–8.0)
Protein, ur: NEGATIVE mg/dL
SPECIFIC GRAVITY, URINE: 1.023 (ref 1.005–1.030)

## 2016-09-09 LAB — CBC WITH DIFFERENTIAL/PLATELET
Basophils Absolute: 0.1 10*3/uL (ref 0.0–0.1)
Basophils Relative: 1 %
EOS PCT: 3 %
Eosinophils Absolute: 0.4 10*3/uL (ref 0.0–0.7)
HCT: 40.7 % (ref 39.0–52.0)
Hemoglobin: 13.7 g/dL (ref 13.0–17.0)
LYMPHS ABS: 2.6 10*3/uL (ref 0.7–4.0)
LYMPHS PCT: 22 %
MCH: 27.5 pg (ref 26.0–34.0)
MCHC: 33.7 g/dL (ref 30.0–36.0)
MCV: 81.6 fL (ref 78.0–100.0)
MONO ABS: 0.5 10*3/uL (ref 0.1–1.0)
MONOS PCT: 4 %
Neutro Abs: 8.4 10*3/uL — ABNORMAL HIGH (ref 1.7–7.7)
Neutrophils Relative %: 70 %
PLATELETS: 343 10*3/uL (ref 150–400)
RBC: 4.99 MIL/uL (ref 4.22–5.81)
RDW: 15.9 % — AB (ref 11.5–15.5)
WBC: 12 10*3/uL — ABNORMAL HIGH (ref 4.0–10.5)

## 2016-09-09 LAB — RAPID URINE DRUG SCREEN, HOSP PERFORMED
AMPHETAMINES: NOT DETECTED
BENZODIAZEPINES: POSITIVE — AB
Barbiturates: NOT DETECTED
COCAINE: NOT DETECTED
OPIATES: NOT DETECTED
TETRAHYDROCANNABINOL: POSITIVE — AB

## 2016-09-09 LAB — SALICYLATE LEVEL: Salicylate Lvl: <7 mg/dL (ref 2.8–30.0)

## 2016-09-09 LAB — I-STAT CG4 LACTIC ACID, ED: Lactic Acid, Venous: 1.64 mmol/L (ref 0.5–1.9)

## 2016-09-09 LAB — ETHANOL: Alcohol, Ethyl (B): <5 mg/dL (ref <5)

## 2016-09-09 LAB — ACETAMINOPHEN LEVEL: Acetaminophen (Tylenol), Serum: <10 ug/mL — ABNORMAL LOW (ref 10–30)

## 2016-09-09 MED ORDER — SODIUM CHLORIDE 0.9 % IV BOLUS (SEPSIS)
1000.0000 mL | Freq: Once | INTRAVENOUS | Status: AC
  Administered 2016-09-09: 1000 mL via INTRAVENOUS

## 2016-09-09 NOTE — ED Triage Notes (Signed)
Pt sleeping at present time skin warm and dry, resp even and non labored

## 2016-09-09 NOTE — Discharge Instructions (Signed)
Substance Abuse Treatment Programs ° °Intensive Outpatient Programs °[facility]     °[address]      °[place], [place]                   °[phone]      ° °[facility] °[address] °[place], [place] °336-379-7146 ° °[facility]     °(Inpatient and outpatient)     °[address]           °[phone]   ° °[facility] °336-288-1484 (Suboxone and Methadone) ° °[address]      °[place], [place] [address]      °336-882-2125      ° °[address] °[place], [place] °852-3033 ° °[facility] (Outpatient/Inpatient, Chemical)    °(insurance only) [phone]      °       °[facility] (Groups & Residential) °[place], [place] °336-389-1413 ° °   °[facility]     °[address]     °[place], [place]      °336-389-1413      ° °[facility] (for caregivers and family) °[address] °[place], [place] °336-299-4655 ° ° ° ° ° °Residential Treatment Programs °[facility]      °[address], [place], [place] [address]  °(336) [phone]      ° °[facility] °[address] [place], [place] [address] °[phone] ° °[facility]        °336-248-8914      ° °[facility] °1-800-659-3381 ° °[facility]             °[address]                                         °[place], [place]                                                °877-615-2722 or 336-784-9470                              ° °[facility] [place] °[address] °[place], [address] °1.877.941.8954 ° °[facility]    °[address]      °[place], [place]     °336-273-5306      ° °[facility] °[address] °[place] °336-285-9073 ° °[facility]   °[address]     °[place], [place] [address]     °336-899-1550      °Admissions: 8am-3pm M-F ° °[facility] °[address] °[place],  [place] °336-227-7417 ° °[facility]: Residential Program (90 Days)   °[place], [place]      °336-725-8389 or 800-758-6077    ° °[facility] °[place], [place] °(Walk in Hours over the weekend or by referral) ° °[facility] °[address], [place],  [address] °(336) [phone] ° °Crisis Mobile: [facility]:  1-877-626-1772 (for crisis response 24 hours a day) °[facility] Hotline:      1-800-256-2452 °Outpatient Psychiatry and Counseling ° °[facility]: Mobile Crisis   Management 24 hours:  1-877-626-1772 ° °[facility] sliding scale fee and walk in schedule: M-F 8am-12pm/1pm-3pm °[address]  °[place], [place] [address] °[phone] ° °[facility] °[address] °[place], [place] [address] °336-703-9650 ° °[facility] ([facility] known [facility])- new patient walk-in appointments available Monday - Friday 8am -3pm.          °[address] °[place], [place] [address] °336-676-6840 or crisis line- 336-676-6905 ° °[facility] Intensive Outpatient Therapy Program °[address] °[place], [place] [address] °336-832-9804 ° °[facility]                  °Crisis Services      °336.641.4993      °[address]     °[place], [place] [address]                ° °[facility]   °[facility] °[phone] °[address] °[place], [place] [address] ° ° °[facility]          °[address]  °[place], [place] [address]       °(336) [phone] ° °[facility] °[address] °[place], [place] [address] °336-292-1510 ° °[facility]    °[address]    °[place], [place] [address]     °336-632-3505      ° °Parish McKinney, MD     °[address]     °[place] [address]     °336-282-1251     °  °[facility] °[address] °[place] [address] ° °[facility]     °[address]     °[place], [place]      °336-542-2076      ° °[facility] °Shamsher Ahluwalia, MD °[address] °[place], [place] [address] °336-420-9558 ° °[facility]     °[address]     °[place], [place] [address]     °336-274-1237      ° °[facility] °[address] °[place], [place] [address] °336-854-4450 °Resources for Temporary Residential Assistance/Crisis Centers ° °DAY CENTERS °[facility] °M-F 8am-3pm   °[address] [place], [place] [address]   336-332-0824 °Services include: laundry, barbering, support groups, case management, phone  & computer access, showers, AA/NA mtgs, mental health/substance abuse nurse, job skills class, disability information, VA assistance, spiritual classes, etc.  ° °HOMELESS SHELTERS ° °[facility]     °[facility]   °[address], [place]     °336.271.5959       °       °[facility] (women and children)       °[address] °, [place] [address] °336-275-0820 °[email or web address] for application and process °Application Required ° °[facility]   °[address]    °[place] [address]     °336.886.4922       °             °[facility] °[address] °, [place] [address] °336.273.5572 °336-235-0363(schedule application appt.) °Application Required ° °[facility] (women only)    °[address]     °[place],  [address]     °336-884-1039      °  Intake starts 6pm daily °Need valid ID, SSC, & Police report °[facility] [place] °[address] °[place], [place] °336-881-5420 °Application Required ° °[facility] (men only)     °[address]      °[place], [place]     °[phone]      ° °[facility] °(Pregnant women only) °[address] °[place], [place] °336-275-0206 ° °[facility]      °[address]      °[place], [place] [address]     °336-722-9951      °       °[facility] °[address] °[place], [place] °336-723-1848 °90 day commitment/SA/Application process ° °[facility](men only)     °[address]     °[place], [place]     °336-748-1962       °Check-in at 7pm     °       °[facility] °[address] °[place], [place] [address] °336-248-6684 °Men/Women/Women and Children must be there by 7 pm ° °[facility] °[place], [place] °336-722-8721                ° °

## 2016-09-09 NOTE — ED Triage Notes (Signed)
Pt spoke with mother who has his wallet and [employer] has helped him locate the location of his vehicle.
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
## 2016-09-09 NOTE — ED Notes (Signed)
Patient stated he took a couple of xanax tonight-patient will awaken to verbal and tactile stimuli

## 2016-09-09 NOTE — ED Provider Notes (Signed)
[facility], PA-C at shift change. Please see her note for further. Briefly, patient presented to the emergency department with intoxication. He admitted to taking Xanax and smoking marijuana. He reports he took this to get high. Workup here is reassuring. Plan is for discharge after patient sobers up.  At my evaluation the patient is alert and oriented. He ambulance with normal gait. He does not appear to be intoxicated. He is not sleepy. He tells me he took Xanax to get high. He was not trying to hurt himself. He denies suicidal or homicidal ideations. We will discharge at this time. I provided him with substance abuse treatment information. Return precautions discussed. I advised the patient to follow-up with their primary care provider this week. I advised the patient to return to the emergency department with new or worsening symptoms or new concerns. The patient verbalized understanding and agreement with plan.   Drug intoxication without complication [facility](HCC)  Marijuana abuse       Everlene FarrierDansie, Shermar Friedland, Cordelia Poche-C 09/09/16 1035    Dione BoozeGlick, David, MD 09/09/16 2255
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
## 2016-09-09 NOTE — ED Notes (Signed)
Pt ambulatory with steady gait 

## 2016-09-09 NOTE — ED Notes (Signed)
Patient is more awake/re-evaluated by Dahlia ClientHannah EDPA

## 2017-07-21 DIAGNOSIS — K5904 Chronic idiopathic constipation: Secondary | ICD-10-CM | POA: Insufficient documentation

## 2018-08-20 ENCOUNTER — Emergency Department (HOSPITAL_COMMUNITY)
Admission: EM | Admit: 2018-08-20 | Discharge: 2018-08-20 | Discharge status: Against Medical Advice | Payer: Medicaid Other

## 2018-08-20 ENCOUNTER — Other Ambulatory Visit: Payer: Self-pay

## 2018-08-20 NOTE — ED Triage Notes (Signed)
Pt told registration he is leaving 

## 2018-08-21 ENCOUNTER — Emergency Department (HOSPITAL_COMMUNITY): Payer: Medicaid Other

## 2018-08-21 ENCOUNTER — Emergency Department (HOSPITAL_COMMUNITY)
Admission: EM | Admit: 2018-08-21 | Discharge: 2018-08-21 | Disposition: A | Discharge status: Home / Self Care | Payer: Medicaid Other | Attending: Emergency Medicine | Admitting: Emergency Medicine

## 2018-08-21 ENCOUNTER — Encounter (HOSPITAL_COMMUNITY): Payer: Self-pay

## 2018-08-21 DIAGNOSIS — F17200 Nicotine dependence, unspecified, uncomplicated: Secondary | ICD-10-CM | POA: Insufficient documentation

## 2018-08-21 DIAGNOSIS — K6289 Other specified diseases of anus and rectum: Secondary | ICD-10-CM | POA: Diagnosis present

## 2018-08-21 DIAGNOSIS — Z880 Allergy status to penicillin: Secondary | ICD-10-CM | POA: Insufficient documentation

## 2018-08-21 DIAGNOSIS — Z79899 Other long term (current) drug therapy: Secondary | ICD-10-CM | POA: Insufficient documentation

## 2018-08-21 DIAGNOSIS — K610 Anal abscess: Secondary | ICD-10-CM | POA: Diagnosis not present

## 2018-08-21 DIAGNOSIS — I10 Essential (primary) hypertension: Secondary | ICD-10-CM | POA: Diagnosis not present

## 2018-08-21 LAB — BASIC METABOLIC PANEL
Anion gap: 11 (ref 5–15)
BUN: 18 mg/dL (ref 6–20)
CO2: 22 mmol/L (ref 22–32)
Calcium: 8.9 mg/dL (ref 8.9–10.3)
Chloride: 105 mmol/L (ref 98–111)
Creatinine, Ser: 0.93 mg/dL (ref 0.61–1.24)
GFR calc Af Amer: >60 mL/min (ref >60)
GFR calc non Af Amer: >60 mL/min (ref >60)
Glucose, Bld: 100 mg/dL — ABNORMAL HIGH (ref 70–99)
Potassium: 4 mmol/L (ref 3.5–5.1)
Sodium: 138 mmol/L (ref 135–145)

## 2018-08-21 LAB — CBC
HCT: 44.6 % (ref 39.0–52.0)
Hemoglobin: 14.5 g/dL (ref 13.0–17.0)
MCH: 27.9 pg (ref 26.0–34.0)
MCHC: 32.5 g/dL (ref 30.0–36.0)
MCV: 85.8 fL (ref 80.0–100.0)
Platelets: 365 10*3/uL (ref 150–400)
RBC: 5.2 MIL/uL (ref 4.22–5.81)
RDW: 14.7 % (ref 11.5–15.5)
WBC: 16.2 10*3/uL — ABNORMAL HIGH (ref 4.0–10.5)
nRBC: 0 % (ref 0.0–0.2)

## 2018-08-21 MED ORDER — HYDROCODONE-ACETAMINOPHEN 5-325 MG PO TABS: 1.0000 | ORAL_TABLET | Freq: Four times a day (QID) | ORAL | 0 refills | Status: DC | PRN

## 2018-08-21 MED ORDER — SULFAMETHOXAZOLE-TRIMETHOPRIM 800-160 MG PO TABS
1.0000 | ORAL_TABLET | Freq: Once | ORAL | Status: AC
  Administered 2018-08-21: 1 via ORAL
  Filled 2018-08-21: qty 1

## 2018-08-21 MED ORDER — SULFAMETHOXAZOLE-TRIMETHOPRIM 800-160 MG PO TABS: 1.0000 | ORAL_TABLET | Freq: Two times a day (BID) | ORAL | 0 refills | Status: AC

## 2018-08-21 MED ORDER — IOHEXOL 300 MG/ML  SOLN
100.0000 mL | Freq: Once | INTRAMUSCULAR | Status: AC | PRN
  Administered 2018-08-21: 100 mL via INTRAVENOUS

## 2018-08-21 MED ORDER — IBUPROFEN 200 MG PO TABS
400.0000 mg | ORAL_TABLET | Freq: Once | ORAL | Status: DC | PRN
  Filled 2018-08-21: qty 2

## 2018-08-21 MED ORDER — SODIUM CHLORIDE (PF) 0.9 % IJ SOLN
INTRAMUSCULAR | Status: AC
  Administered 2018-08-21: 06:00:00
  Filled 2018-08-21: qty 50

## 2018-08-21 MED ORDER — NAPROXEN 500 MG PO TABS: 500.0000 mg | ORAL_TABLET | Freq: Two times a day (BID) | ORAL | 0 refills | Status: DC | PRN

## 2018-08-21 MED ORDER — KETOROLAC TROMETHAMINE 30 MG/ML IJ SOLN
15.0000 mg | Freq: Once | INTRAMUSCULAR | Status: AC
  Administered 2018-08-21: 15 mg via INTRAVENOUS
  Filled 2018-08-21: qty 1

## 2018-08-21 MED ORDER — LIDOCAINE-EPINEPHRINE (PF) 2 %-1:200000 IJ SOLN
10.0000 mL | Freq: Once | INTRAMUSCULAR | Status: AC
  Administered 2018-08-21: 10 mL
  Filled 2018-08-21: qty 10

## 2018-08-21 MED ORDER — HYDROCODONE-ACETAMINOPHEN 5-325 MG PO TABS
1.0000 | ORAL_TABLET | Freq: Once | ORAL | Status: AC
  Administered 2018-08-21: 08:00:00 1 via ORAL
  Filled 2018-08-21: qty 1

## 2018-08-21 NOTE — ED Triage Notes (Signed)
Pt arrived stating he has been treated for hemmoriods with creams and noticed he has an abscess on his rectum over the last two days. Reports trying epsom salt bath with no relief. Pt states he took two pain pills prior to arrival.

## 2018-08-21 NOTE — ED Notes (Signed)
Patient transported to radiology

## 2018-08-21 NOTE — ED Provider Notes (Signed)
[facility]-EMERGENCY DEPT Provider Note   CSN: [id number] Arrival date & time: 08/21/18  0056    History   Chief Complaint Chief Complaint  Patient presents with  . Abscess    HPI Brian Gilbert is a 43 y.o. male.     43 year old male with history of hypertension and seizures presents to the emergency department for evaluation of rectal pain x3 days.  He states that pain is been constant and worsening.  It is specifically aggravated with sitting as well as when having a bowel movement.  He has been unable to pass very little stool secondary to increasing pain.  Notes some improvement from naproxen and Epsom salt soaks.  Endorses history of hemorrhoids for the past 10 years which he manages topically with lidocaine jelly and cortisone cream.  Initially felt that his symptoms were secondary to a hemorrhoid or fissure, but he noticed no improvement to his symptoms with these topical aids.  Denies fever, drainage from the rectum, melena or hematochezia.  The history is provided by the patient. No language interpreter was used.  Abscess   Past Medical History:  Diagnosis Date  . Hypertension   . Seizures [facility](HCC)     Patient Active Problem List   Diagnosis Date Noted  . Acute pyelonephritis 07/30/2016  . Seizures (HCC) 07/30/2016  . Essential hypertension 07/30/2016    History reviewed. No pertinent surgical history.      Home Medications    Prior to Admission medications   Medication Sig Start Date End Date Taking? Authorizing Provider  hydrocortisone (ANUSOL-HC) 2.5 % rectal cream Place 1 application rectally 3 (three) times daily as needed for hemorrhoids.  06/02/18  Yes [provider]  ibuprofen (ADVIL) 200 MG tablet Take 600 mg by mouth every 8 (eight) hours as needed for moderate pain.   Yes [provider]  lidocaine (XYLOCAINE) 5 % ointment Apply 1 application topically 3 (three) times daily as needed for moderate pain.   02/11/18  Yes [provider]  omeprazole (PRILOSEC) 20 MG capsule Take 20 mg by mouth daily. 06/27/18  Yes [provider]  acetaminophen (TYLENOL) 325 MG tablet Take 2 tablets (650 mg total) by mouth every 6 (six) hours as needed for mild pain (or Fever >/= 101). Patient not taking: Reported on 09/09/2016 08/04/16   Leroy SeaSingh, Prashant K, MD  bisacodyl (DULCOLAX) 5 MG EC tablet Take 1 tablet (5 mg total) by mouth 2 (two) times daily as needed for mild constipation or moderate constipation. Patient not taking: Reported on 09/09/2016 08/04/16   Leroy SeaSingh, Prashant K, MD  docusate sodium (COLACE) 100 MG capsule Take 1 capsule (100 mg total) by mouth 2 (two) times daily. Patient not taking: Reported on 09/09/2016 08/04/16   Leroy SeaSingh, Prashant K, MD  HYDROcodone-acetaminophen (NORCO/VICODIN) 5-325 MG tablet Take 1 tablet by mouth every 6 (six) hours as needed for severe pain. 08/21/18   Antony MaduraHumes, Merrin Mcvicker, PA-C  naproxen (NAPROSYN) 500 MG tablet Take 1 tablet (500 mg total) by mouth every 12 (twelve) hours as needed for mild pain or moderate pain. 08/21/18   Antony MaduraHumes, Shamara Soza, PA-C  saccharomyces boulardii (FLORASTOR) 250 MG capsule Take 1 capsule (250 mg total) by mouth 2 (two) times daily. Patient not taking: Reported on 09/09/2016 08/04/16   Leroy SeaSingh, Prashant K, MD  sulfamethoxazole-trimethoprim (BACTRIM DS) 800-160 MG tablet Take 1 tablet by mouth 2 (two) times daily for 7 days. 08/21/18 08/28/18  Antony MaduraHumes, Cuauhtemoc Huegel, PA-C    Family History Family History  Problem  Relation Age of Onset  . COPD Maternal Grandmother   . Hypertension Maternal Grandmother     Social History Social History   Tobacco Use  . Smoking status: Current Every Day Smoker  . Smokeless tobacco: Never Used  Substance Use Topics  . Alcohol use: No  . Drug use: Yes    Types: Marijuana    Comment: occasional     Allergies   Penicillins   Review of Systems Review of Systems Ten systems reviewed and are negative for acute change, except as  noted in the HPI.    Physical Exam Updated Vital Signs BP 122/78 (BP Location: Left Arm)   Pulse (!) 58   Temp 99.1 F (37.3 C) (Oral)   Resp 18   Ht 5\' 11"  (1.803 m)   Wt 102.1 kg   SpO2 100%   BMI 31.38 kg/m   Physical Exam Vitals signs and nursing note reviewed.  Constitutional:      General: He is not in acute distress.    Appearance: He is well-developed. He is not diaphoretic.     Comments: Nontoxic appearing and in NAD  HENT:     Head: Normocephalic and atraumatic.  Eyes:     General: No scleral icterus.    Conjunctiva/sclera: Conjunctivae normal.  Neck:     Musculoskeletal: Normal range of motion.  Pulmonary:     Effort: Pulmonary effort is normal. No respiratory distress.     Comments: Respirations even and unlabored Genitourinary:    Rectum: Tenderness present.       Comments: Exam chaperoned by Chelsea AusLilli, RN tech Musculoskeletal: Normal range of motion.  Skin:    General: Skin is warm and dry.     Coloration: Skin is not pale.     Findings: No erythema or rash.  Neurological:     General: No focal deficit present.     Mental Status: He is alert and oriented to person, place, and time.     Coordination: Coordination normal.  Psychiatric:        Behavior: Behavior normal.      ED Treatments / Results  Labs (all labs ordered are listed, but only abnormal results are displayed) Labs Reviewed  CBC - Abnormal; Notable for the following components:      Result Value   WBC 16.2 (*)    All other components within normal limits  BASIC METABOLIC PANEL - Abnormal; Notable for the following components:   Glucose, Bld 100 (*)    All other components within normal limits    EKG None  Radiology Ct Pelvis W Contrast  Result Date: 08/21/2018 CLINICAL DATA:  Abscess of the anal and rectal regions. EXAM: CT PELVIS WITH CONTRAST TECHNIQUE: Multidetector CT imaging of the pelvis was performed using the standard protocol following the bolus administration of  intravenous contrast. CONTRAST:  100mL OMNIPAQUE IOHEXOL 300 MG/ML  SOLN COMPARISON:  CT scan dated 07/30/2016 FINDINGS: Urinary Tract: Normal. Bowel:  Unremarkable visualized pelvic bowel loops. Vascular/Lymphatic: No pathologically enlarged lymph nodes. No significant vascular abnormality seen. Reproductive:  No mass or other significant abnormality Other: There is a perianal abscess measuring 5 x 3.4 x 3.4 cm extending into the left buttock subcutaneous soft tissues. Musculoskeletal: Normal. IMPRESSION: Perianal abscess in the left buttock. Electronically Signed   By: Francene BoyersJames  Maxwell M.D.   On: 08/21/2018 06:34    Procedures Procedures (including critical care time)  INCISION AND DRAINAGE Performed by: Antony MaduraKelly Kensli Bowley Consent: Verbal consent obtained. Risks and benefits: risks, benefits  and alternatives were discussed Type: abscess  Body area: L perianal  Anesthesia: local infiltration  Incision was made with a scalpel.  Local anesthetic: lidocaine 2% with epinephrine  Anesthetic total: 15 ml  Complexity: complex Blunt dissection to break up loculations  Drainage: purulent  Drainage amount: copious, >10cc  Packing material: 1/4 in iodoform gauze  Patient tolerance: Patient tolerated the procedure well with no immediate complications.   Medications Ordered in ED Medications  ibuprofen (ADVIL) tablet 400 mg (has no administration in time range)  sulfamethoxazole-trimethoprim (BACTRIM DS) 800-160 MG per tablet 1 tablet (has no administration in time range)  HYDROcodone-acetaminophen (NORCO/VICODIN) 5-325 MG per tablet 1 tablet (has no administration in time range)  ketorolac (TORADOL) 30 MG/ML injection 15 mg (15 mg Intravenous Given 08/21/18 0538)  sodium chloride (PF) 0.9 % injection (  Given by Other 08/21/18 0612)  iohexol (OMNIPAQUE) 300 MG/ML solution 100 mL (100 mLs Intravenous Contrast Given 08/21/18 0612)  lidocaine-EPINEPHrine (XYLOCAINE W/EPI) 2 %-1:200000 (PF) injection 10  mL (10 mLs Other Given by Other 08/21/18 0713)    6:58 AM CT with perianal abscess. No evidence of perirectal changes. Plan for beside I&D.   Initial Impression / Assessment and Plan / ED Course  I have reviewed the triage vital signs and the nursing notes.  Pertinent labs & imaging results that were available during my care of the patient were reviewed by me and considered in my medical decision making (see chart for details).        Patient with skin abscess amenable to incision and drainage.  Abscess packed with iodoform gauze; advised wound recheck in 2 days. Encouraged home warm soaks and flushing.  Mild signs of cellulitis is surrounding skin.  Will d/c to home on Bactrim.  Short course of Norco given for pain control in addition to Naproxen.  Return precautions discussed and provided. Patient discharged in stable condition with no unaddressed concerns.   Final Clinical Impressions(s) / ED Diagnoses   Final diagnoses:  Perianal abscess    ED Discharge Orders         Ordered    sulfamethoxazole-trimethoprim (BACTRIM DS) 800-160 MG tablet  2 times daily     08/21/18 0750    HYDROcodone-acetaminophen (NORCO/VICODIN) 5-325 MG tablet  Every 6 hours PRN     08/21/18 0750    naproxen (NAPROSYN) 500 MG tablet  Every 12 hours PRN     08/21/18 0750           Antonietta Breach, PA-C 08/21/18 [place], [place], MD 08/24/18 [phone]

## 2018-08-21 NOTE — Discharge Instructions (Addendum)
Continue warm compresses 3-4 times per day.  Change your dressing frequently, especially after each bowel movement, to keep the area clean.  Take Bactrim as prescribed until finished.  Have your packing removed in 2 days and the wound rechecked.  You may return to the ED sooner if symptoms worsen.

## 2018-08-22 ENCOUNTER — Encounter (HOSPITAL_COMMUNITY): Payer: Self-pay

## 2018-08-22 ENCOUNTER — Other Ambulatory Visit: Payer: Self-pay

## 2018-08-22 DIAGNOSIS — R21 Rash and other nonspecific skin eruption: Secondary | ICD-10-CM | POA: Diagnosis present

## 2018-08-22 DIAGNOSIS — I10 Essential (primary) hypertension: Secondary | ICD-10-CM | POA: Insufficient documentation

## 2018-08-22 DIAGNOSIS — T7840XA Allergy, unspecified, initial encounter: Secondary | ICD-10-CM | POA: Insufficient documentation

## 2018-08-22 DIAGNOSIS — F17200 Nicotine dependence, unspecified, uncomplicated: Secondary | ICD-10-CM | POA: Insufficient documentation

## 2018-08-22 NOTE — ED Triage Notes (Signed)
Pt arrived stating he took the antibiotics after being seen here last night. States that he had some nausea and vomiting but read that it was a normal side effect. Pt states today he noticed his skin peeling around his penis and left sided groin itching.

## 2018-08-23 ENCOUNTER — Emergency Department (HOSPITAL_COMMUNITY)
Admission: EM | Admit: 2018-08-23 | Discharge: 2018-08-23 | Disposition: A | Discharge status: Home / Self Care | Payer: Medicaid Other | Attending: Emergency Medicine | Admitting: Emergency Medicine

## 2018-08-23 DIAGNOSIS — T7840XA Allergy, unspecified, initial encounter: Secondary | ICD-10-CM

## 2018-08-23 MED ORDER — PREDNISONE 10 MG PO TABS: 50.0000 mg | ORAL_TABLET | Freq: Every day | ORAL | 0 refills | Status: DC

## 2018-08-23 MED ORDER — AMOXICILLIN-POT CLAVULANATE 875-125 MG PO TABS: 1.0000 | ORAL_TABLET | Freq: Two times a day (BID) | ORAL | 0 refills | Status: DC

## 2018-08-23 MED ORDER — DEXAMETHASONE SODIUM PHOSPHATE 10 MG/ML IJ SOLN
10.0000 mg | Freq: Once | INTRAMUSCULAR | Status: AC
  Administered 2018-08-23: 10 mg via INTRAMUSCULAR
  Filled 2018-08-23: qty 1

## 2018-08-23 MED ORDER — DIPHENHYDRAMINE HCL 25 MG PO CAPS
50.0000 mg | ORAL_CAPSULE | Freq: Once | ORAL | Status: AC
  Administered 2018-08-23: 50 mg via ORAL
  Filled 2018-08-23: qty 2

## 2018-08-23 MED ORDER — DIPHENHYDRAMINE HCL 25 MG PO CAPS: 25.0000 mg | ORAL_CAPSULE | Freq: Four times a day (QID) | ORAL | 0 refills | Status: DC | PRN

## 2018-08-23 MED ORDER — CLINDAMYCIN HCL 300 MG PO CAPS: 300.0000 mg | ORAL_CAPSULE | Freq: Four times a day (QID) | ORAL | 0 refills | Status: DC

## 2018-08-23 NOTE — ED Provider Notes (Signed)
[facility]-EMERGENCY DEPT Provider Note   CSN: [id number] Arrival date & time: 08/22/18  2154    History   Chief Complaint Chief Complaint  Patient presents with   Allergic Reaction    HPI Brian Gilbert is a 43 y.o. male.     HPI 43 year old male comes in a chief complaint of rash. Patient was recently seen in the ED for anal abscess which was drained.  He was started on Bactrim early in the morning on 7/9.  On 7-10 he started having some itching in the groin area.  Subsequently when he checked the groin area he noted swelling and redness in his penis along with sloughing of some skin.  He has itching.  He denies any pain with urination, change in vision, oral ulcers, skin rash elsewhere, fevers.  He thinks he has taken Bactrim in the past without problems.  Past Medical History:  Diagnosis Date   Hypertension    Seizures [facility](HCC)     Patient Active Problem List   Diagnosis Date Noted   Acute pyelonephritis 07/30/2016   Seizures (HCC) 07/30/2016   Essential hypertension 07/30/2016    History reviewed. No pertinent surgical history.      Home Medications    Prior to Admission medications   Medication Sig Start Date End Date Taking? Authorizing Provider  albuterol (VENTOLIN HFA) 108 (90 Base) MCG/ACT inhaler Inhale 2 puffs into the lungs every 6 (six) hours as needed for wheezing or shortness of breath.   Yes [provider]  HYDROcodone-acetaminophen (NORCO/VICODIN) 5-325 MG tablet Take 1 tablet by mouth every 6 (six) hours as needed for severe pain. 08/21/18  Yes Antony MaduraHumes, Kelly, PA-C  hydrocortisone (ANUSOL-HC) 2.5 % rectal cream Place 1 application rectally 3 (three) times daily as needed for hemorrhoids.  06/02/18  Yes [provider]  ibuprofen (ADVIL) 200 MG tablet Take 600 mg by mouth every 8 (eight) hours as needed for moderate pain.   Yes [provider]  lidocaine (XYLOCAINE) 5 % ointment Apply 1 application  topically 3 (three) times daily as needed for moderate pain.  02/11/18  Yes [provider]  naproxen (NAPROSYN) 500 MG tablet Take 1 tablet (500 mg total) by mouth every 12 (twelve) hours as needed for mild pain or moderate pain. 08/21/18  Yes Antony MaduraHumes, Kelly, PA-C  omeprazole (PRILOSEC) 20 MG capsule Take 20 mg by mouth daily. 06/27/18  Yes [provider]  polyethylene glycol (MIRALAX / GLYCOLAX) 17 g packet Take 17 g by mouth daily as needed for mild constipation.   Yes [provider]  sulfamethoxazole-trimethoprim (BACTRIM DS) 800-160 MG tablet Take 1 tablet by mouth 2 (two) times daily for 7 days. 08/21/18 08/28/18 Yes Antony MaduraHumes, Kelly, PA-C  acetaminophen (TYLENOL) 325 MG tablet Take 2 tablets (650 mg total) by mouth every 6 (six) hours as needed for mild pain (or Fever >/= 101). Patient not taking: Reported on 09/09/2016 08/04/16   Leroy SeaSingh, Prashant K, MD  amoxicillin-clavulanate (AUGMENTIN) 875-125 MG tablet Take 1 tablet by mouth every 12 (twelve) hours. 08/23/18   Derwood KaplanNanavati, Deshanti Adcox, MD  bisacodyl (DULCOLAX) 5 MG EC tablet Take 1 tablet (5 mg total) by mouth 2 (two) times daily as needed for mild constipation or moderate constipation. Patient not taking: Reported on 09/09/2016 08/04/16   Leroy SeaSingh, Prashant K, MD  diphenhydrAMINE (BENADRYL) 25 mg capsule Take 1 capsule (25 mg total) by mouth every 6 (six) hours as needed for itching. 08/23/18   Derwood KaplanNanavati, Jayme Mednick, MD  docusate sodium (  COLACE) 100 MG capsule Take 1 capsule (100 mg total) by mouth 2 (two) times daily. Patient not taking: Reported on 09/09/2016 08/04/16   Leroy SeaSingh, Prashant K, MD  predniSONE (DELTASONE) 10 MG tablet Take 5 tablets (50 mg total) by mouth daily. 08/23/18   Derwood KaplanNanavati, Jordie Skalsky, MD  saccharomyces boulardii (FLORASTOR) 250 MG capsule Take 1 capsule (250 mg total) by mouth 2 (two) times daily. Patient not taking: Reported on 09/09/2016 08/04/16   Leroy SeaSingh, Prashant K, MD    Family History Family History  Problem Relation Age of  Onset   COPD Maternal Grandmother    Hypertension Maternal Grandmother     Social History Social History   Tobacco Use   Smoking status: Current Every Day Smoker   Smokeless tobacco: Never Used  Substance Use Topics   Alcohol use: No   Drug use: Yes    Types: Marijuana    Comment: occasional     Allergies   Penicillins   Review of Systems Review of Systems  Constitutional: Positive for activity change.  Eyes: Negative for visual disturbance.  Skin: Positive for rash.  Allergic/Immunologic: Negative for environmental allergies, food allergies and immunocompromised state.     Physical Exam Updated Vital Signs BP (!) 148/88 (BP Location: Left Arm)    Pulse 70    Temp 98.5 F (36.9 C) (Oral)    Resp 17    SpO2 100%   Physical Exam Vitals signs and nursing note reviewed.  Constitutional:      Appearance: He is well-developed.  HENT:     Head: Atraumatic.  Neck:     Musculoskeletal: Neck supple.  Cardiovascular:     Rate and Rhythm: Normal rate.  Pulmonary:     Effort: Pulmonary effort is normal.  Genitourinary:    Comments: Patient's penile head has some edema, with overlying skin appearing gray and dusky. More proximally, over the foreskin there is erythematous macular lesions without any vesicles or bulla.  He does have skin sloughing and ulcerated lesion as well over the foreskin.  No penile discharge, no scrotal erythema, no inguinal lymphadenopathy Skin:    General: Skin is warm.  Neurological:     Mental Status: He is alert and oriented to person, place, and time.      ED Treatments / Results  Labs (all labs ordered are listed, but only abnormal results are displayed) Labs Reviewed - No data to display  EKG None  Radiology Ct Pelvis W Contrast  Result Date: 08/21/2018 CLINICAL DATA:  Abscess of the anal and rectal regions. EXAM: CT PELVIS WITH CONTRAST TECHNIQUE: Multidetector CT imaging of the pelvis was performed using the standard  protocol following the bolus administration of intravenous contrast. CONTRAST:  100mL OMNIPAQUE IOHEXOL 300 MG/ML  SOLN COMPARISON:  CT scan dated 07/30/2016 FINDINGS: Urinary Tract: Normal. Bowel:  Unremarkable visualized pelvic bowel loops. Vascular/Lymphatic: No pathologically enlarged lymph nodes. No significant vascular abnormality seen. Reproductive:  No mass or other significant abnormality Other: There is a perianal abscess measuring 5 x 3.4 x 3.4 cm extending into the left buttock subcutaneous soft tissues. Musculoskeletal: Normal. IMPRESSION: Perianal abscess in the left buttock. Electronically Signed   By: Francene BoyersJames  Maxwell M.D.   On: 08/21/2018 06:34    Procedures Procedures (including critical care time)  Medications Ordered in ED Medications  dexamethasone (DECADRON) injection 10 mg (10 mg Intramuscular Given 08/23/18 0427)  diphenhydrAMINE (BENADRYL) capsule 50 mg (50 mg Oral Given 08/23/18 0427)     Initial Impression / Assessment and  Plan / ED Course  I have reviewed the triage vital signs and the nursing notes.  Pertinent labs & imaging results that were available during my care of the patient were reviewed by me and considered in my medical decision making (see chart for details).        43 year old with history of perianal abscess for which he was started on Bactrim comes in with chief complaint of penile rash. It appears that he is having isolated lesion over the penis.  No oral mucosal involvement.  He was started on Bactrim 2 days ago, symptoms started 24 hours ago and have progressed.  He does not have any oral, ocular complaints nor does he have any systemic symptoms, however Bactrim has high propensity to cause Stevens-Johnson's, and the concern is that this could be early Stevens-Johnson's.  We will contact [facility] to get the recommendations.  5:49 AM I discussed the case with Dr. Vertell Limber at [facility]. Dr. Vertell Limber thinks that Katherina Right is  is less likely given that the Bactrim was started within the last 72 hours, and there is no other organ system involvement.  I discussed with him that my only concern was that this could be early onset of Remo Lipps Johnson's, given that we do not have an explanation for the urogenital lesion that patient is having.  Dr. Vertell Limber recommends for now the patient return to the ER if his symptoms get worse and to discontinue Bactrim.  Patient informs me that amoxicillin has helped him in the past.  He is sure that he has never had any reaction to amoxicillin.  I will switch his antibiotics to Augmentin.  I removed his packing, there is no purulent drainage from the gluteal abscess.  I have advised him to come back in for recheck in 2 days. If he is not getting better, then he will need urology input and possibly f/u. He will return to the ER sooner if symptoms are getting worse.  The patient appears reasonably screened and/or stabilized for discharge and I doubt any other medical condition or other [facility] requiring further screening, evaluation, or treatment in the ED at this time prior to discharge.   Results from the ER workup discussed with the patient face to face and all questions answered to the best of my ability. The patient is safe for discharge with strict return precautions.  Final Clinical Impressions(s) / ED Diagnoses   Final diagnoses:  Allergic reaction, initial encounter    ED Discharge Orders         Ordered    predniSONE (DELTASONE) 10 MG tablet  Daily     08/23/18 0531    diphenhydrAMINE (BENADRYL) 25 mg capsule  Every 6 hours PRN     08/23/18 0531    clindamycin (CLEOCIN) 300 MG capsule  4 times daily,   Status:  Discontinued     08/23/18 0535    amoxicillin-clavulanate (AUGMENTIN) 875-125 MG tablet  Every 12 hours     08/23/18 0548           Varney Biles, MD 08/23/18 [phone]

## 2018-08-23 NOTE — Discharge Instructions (Signed)
Return to the ER in 2 days for recheck. Return sooner to the ER if you start having worsening of the rash, pain to your eye, tearing of the eye, new ulcers in your mouth, new rash over your skin or any blisters forming.

## 2018-08-25 ENCOUNTER — Emergency Department (HOSPITAL_COMMUNITY)
Admission: EM | Admit: 2018-08-25 | Discharge: 2018-08-25 | Disposition: A | Discharge status: Home / Self Care | Payer: Medicaid Other | Attending: Emergency Medicine | Admitting: Emergency Medicine

## 2018-08-25 ENCOUNTER — Encounter (HOSPITAL_COMMUNITY): Payer: Self-pay | Admitting: Emergency Medicine

## 2018-08-25 ENCOUNTER — Other Ambulatory Visit: Payer: Self-pay

## 2018-08-25 DIAGNOSIS — X58XXXD Exposure to other specified factors, subsequent encounter: Secondary | ICD-10-CM | POA: Insufficient documentation

## 2018-08-25 DIAGNOSIS — S30812D Abrasion of penis, subsequent encounter: Secondary | ICD-10-CM

## 2018-08-25 DIAGNOSIS — Z79899 Other long term (current) drug therapy: Secondary | ICD-10-CM | POA: Insufficient documentation

## 2018-08-25 DIAGNOSIS — I10 Essential (primary) hypertension: Secondary | ICD-10-CM | POA: Diagnosis not present

## 2018-08-25 DIAGNOSIS — F17200 Nicotine dependence, unspecified, uncomplicated: Secondary | ICD-10-CM | POA: Diagnosis not present

## 2018-08-25 DIAGNOSIS — Z4800 Encounter for change or removal of nonsurgical wound dressing: Secondary | ICD-10-CM | POA: Diagnosis not present

## 2018-08-25 NOTE — ED Notes (Signed)
Called twice for fast track, no answer

## 2018-08-25 NOTE — ED Notes (Signed)
Patient called 3 times, no answer.

## 2018-08-25 NOTE — ED Provider Notes (Signed)
[facility]-EMERGENCY DEPT Provider Note   CSN: [id number] Arrival date & time: 08/25/18  0850    History   Chief Complaint Chief Complaint  Patient presents with  . Rash    HPI Welford RocheMohammed Pannone is a 43 y.o. male.     The history is provided by the patient and medical records. No language interpreter was used.    Welford RocheMohammed Sulser is a 43 y.o. male  with a PMH as listed below who presents to the Emergency Department for wound check.  Patient was seen in the emergency department on 7/09 for perianal abscess which was drained.  He was started on Bactrim.  He was seen on 7/11 for presumed allergic reaction to Bactrim.  He had area of redness associated with itching on the shaft of his penis.  Bactrim was stopped and he was switched to Augmentin.  Since this visit 2 days ago, he has not had any worsening of his symptoms.  Wound itself he feels is not getting better or worse, but has been persistent.  His itching has resolved.  He has not had any oral lesions, cough, shortness of breath.  No abdominal pain, nausea or vomiting.  No dysuria or penile discharge.  Seems as if symptoms have been stable.   Past Medical History:  Diagnosis Date  . Hypertension   . Seizures [facility](HCC)     Patient Active Problem List   Diagnosis Date Noted  . Acute pyelonephritis 07/30/2016  . Seizures (HCC) 07/30/2016  . Essential hypertension 07/30/2016    History reviewed. No pertinent surgical history.      Home Medications    Prior to Admission medications   Medication Sig Start Date End Date Taking? Authorizing Provider  acetaminophen (TYLENOL) 325 MG tablet Take 2 tablets (650 mg total) by mouth every 6 (six) hours as needed for mild pain (or Fever >/= 101). Patient not taking: Reported on 09/09/2016 08/04/16   Leroy SeaSingh, Prashant K, MD  albuterol (VENTOLIN HFA) 108 (90 Base) MCG/ACT inhaler Inhale 2 puffs into the lungs every 6 (six) hours as needed for wheezing or shortness of  breath.    [provider]  amoxicillin-clavulanate (AUGMENTIN) 875-125 MG tablet Take 1 tablet by mouth every 12 (twelve) hours. 08/23/18   Derwood KaplanNanavati, Ankit, MD  bisacodyl (DULCOLAX) 5 MG EC tablet Take 1 tablet (5 mg total) by mouth 2 (two) times daily as needed for mild constipation or moderate constipation. Patient not taking: Reported on 09/09/2016 08/04/16   Leroy SeaSingh, Prashant K, MD  diphenhydrAMINE (BENADRYL) 25 mg capsule Take 1 capsule (25 mg total) by mouth every 6 (six) hours as needed for itching. 08/23/18   Derwood KaplanNanavati, Ankit, MD  docusate sodium (COLACE) 100 MG capsule Take 1 capsule (100 mg total) by mouth 2 (two) times daily. Patient not taking: Reported on 09/09/2016 08/04/16   Leroy SeaSingh, Prashant K, MD  HYDROcodone-acetaminophen (NORCO/VICODIN) 5-325 MG tablet Take 1 tablet by mouth every 6 (six) hours as needed for severe pain. 08/21/18   Antony MaduraHumes, Kelly, PA-C  hydrocortisone (ANUSOL-HC) 2.5 % rectal cream Place 1 application rectally 3 (three) times daily as needed for hemorrhoids.  06/02/18   [provider]  ibuprofen (ADVIL) 200 MG tablet Take 600 mg by mouth every 8 (eight) hours as needed for moderate pain.    [provider]  lidocaine (XYLOCAINE) 5 % ointment Apply 1 application topically 3 (three) times daily as needed for moderate pain.  02/11/18   [provider]  naproxen (NAPROSYN) 500  MG tablet Take 1 tablet (500 mg total) by mouth every 12 (twelve) hours as needed for mild pain or moderate pain. 08/21/18   Antony MaduraHumes, Kelly, PA-C  omeprazole (PRILOSEC) 20 MG capsule Take 20 mg by mouth daily. 06/27/18   [provider]  polyethylene glycol (MIRALAX / GLYCOLAX) 17 g packet Take 17 g by mouth daily as needed for mild constipation.    [provider]  predniSONE (DELTASONE) 10 MG tablet Take 5 tablets (50 mg total) by mouth daily. 08/23/18   Derwood KaplanNanavati, Ankit, MD  saccharomyces boulardii (FLORASTOR) 250 MG capsule Take 1 capsule (250 mg total) by  mouth 2 (two) times daily. Patient not taking: Reported on 09/09/2016 08/04/16   Leroy SeaSingh, Prashant K, MD  sulfamethoxazole-trimethoprim (BACTRIM DS) 800-160 MG tablet Take 1 tablet by mouth 2 (two) times daily for 7 days. 08/21/18 08/28/18  Antony MaduraHumes, Kelly, PA-C    Family History Family History  Problem Relation Age of Onset  . COPD Maternal Grandmother   . Hypertension Maternal Grandmother     Social History Social History   Tobacco Use  . Smoking status: Current Every Day Smoker  . Smokeless tobacco: Never Used  Substance Use Topics  . Alcohol use: No  . Drug use: Yes    Types: Marijuana    Comment: occasional     Allergies   Penicillins   Review of Systems Review of Systems  Constitutional: Negative for fever.  HENT: Negative for sore throat, trouble swallowing and voice change.   Respiratory: Negative for cough, shortness of breath and wheezing.   Gastrointestinal: Negative for abdominal pain, diarrhea, nausea and vomiting.  Genitourinary: Positive for genital sores. Negative for discharge, dysuria, penile swelling, scrotal swelling and testicular pain.  Skin: Positive for wound.     Physical Exam Updated Vital Signs BP 135/90 (BP Location: Left Arm)   Pulse 72   Temp 98.3 F (36.8 C) (Oral)   Resp 17   SpO2 100%   Physical Exam Vitals signs and nursing note reviewed.  Constitutional:      General: He is not in acute distress.    Appearance: He is well-developed.  HENT:     Head: Normocephalic and atraumatic.     Mouth/Throat:     Comments: No oral lesions. Neck:     Musculoskeletal: Neck supple.  Cardiovascular:     Rate and Rhythm: Normal rate and regular rhythm.     Heart sounds: Normal heart sounds. No murmur.  Pulmonary:     Effort: Pulmonary effort is normal. No respiratory distress.     Breath sounds: Normal breath sounds. No wheezing or rales.  Genitourinary:    Comments: Chaperone present for exam.  See images below.  No sloughing of the skin.   Wound is tender, but no other tenderness.  No other lesions appreciated.  No scrotal tenderness or swelling.  No penile discharge. Musculoskeletal: Normal range of motion.  Skin:    General: Skin is warm and dry.  Neurological:     Mental Status: He is alert.          ED Treatments / Results  Labs (all labs ordered are listed, but only abnormal results are displayed) Labs Reviewed - No data to display  EKG None  Radiology No results found.  Procedures Procedures (including critical care time)  Medications Ordered in ED Medications - No data to display   Initial Impression / Assessment and Plan / ED Course  I have reviewed the triage vital signs and  the nursing notes.  Pertinent labs & imaging results that were available during my care of the patient were reviewed by me and considered in my medical decision making (see chart for details).       Winifred Balogh is a 43 y.o. male who presents to ED for wound check. Patient seen in ED on 7/09 and 7/11.  Chart extensively reviewed from these encounters.  Briefly, patient was seen for perianal abscess on 7/09.  Area was drained and he was started on Bactrim.  He was seen on 7/11 for presumed allergic reaction to Bactrim resulting in penile wound.  Bactrim was discontinued and he was started on Augmentin instead.  Early Stevens-Johnson's was considered, but felt unlikely by consultants.  On exam today, he is afebrile, hemodynamically stable with no oral lesions.  His penile wound has not progressed any further and he has no sloughing of the skin.  Stevens-Johnson's highly unlikely.  Wound itself he does not feel is getting much better, but it is quite reassuring that it is not getting any worse either.  Evaluation does not show pathology that would require ongoing emergent intervention or inpatient treatment. Abscess area is healing well. Continue current management. Patient requesting referral to urology. Feel this is reasonable if  he would like to see specialist for this. Referral provided. Reasons to return to ER discussed and all questions answered.   Patient discussed with Dr. Eulis Foster who agrees with treatment plan.   Final Clinical Impressions(s) / ED Diagnoses   Final diagnoses:  Abrasion of penis, subsequent encounter    ED Discharge Orders    None       Eliot Bencivenga, Ozella Almond, PA-C 08/25/18 1026    Daleen Bo, MD 08/25/18 1413

## 2018-08-25 NOTE — Discharge Instructions (Signed)
It was my pleasure taking care of you today!   Keep wound clean with soap and water.  You can apply topical over-the-counter triple antibiotic ointment.   Call urologist to schedule a follow up appointment.   Return to ER for new or worsening symptoms, any additional concerns.

## 2018-08-25 NOTE — ED Triage Notes (Signed)
Pt states that he is here to get rash in gentile area checked out from last week's allergic reaction.

## 2019-04-22 ENCOUNTER — Emergency Department (HOSPITAL_COMMUNITY)
Admission: EM | Admit: 2019-04-22 | Discharge: 2019-04-22 | Disposition: A | Discharge status: Home / Self Care | Payer: Medicaid Other | Attending: Emergency Medicine | Admitting: Emergency Medicine

## 2019-04-22 ENCOUNTER — Other Ambulatory Visit: Payer: Self-pay

## 2019-04-22 ENCOUNTER — Encounter (HOSPITAL_COMMUNITY): Payer: Self-pay | Admitting: Emergency Medicine

## 2019-04-22 DIAGNOSIS — L089 Local infection of the skin and subcutaneous tissue, unspecified: Secondary | ICD-10-CM

## 2019-04-22 DIAGNOSIS — F17200 Nicotine dependence, unspecified, uncomplicated: Secondary | ICD-10-CM | POA: Insufficient documentation

## 2019-04-22 DIAGNOSIS — K6289 Other specified diseases of anus and rectum: Secondary | ICD-10-CM | POA: Diagnosis present

## 2019-04-22 DIAGNOSIS — I10 Essential (primary) hypertension: Secondary | ICD-10-CM | POA: Insufficient documentation

## 2019-04-22 DIAGNOSIS — Z79899 Other long term (current) drug therapy: Secondary | ICD-10-CM | POA: Insufficient documentation

## 2019-04-22 MED ORDER — DOXYCYCLINE HYCLATE 100 MG PO CAPS: 100.0000 mg | ORAL_CAPSULE | Freq: Two times a day (BID) | ORAL | 0 refills | Status: DC

## 2019-04-22 NOTE — ED Triage Notes (Signed)
Pt reports that he has been seen before here for a boil in his buttocks area. Reports that had it drained and was prescribed Bactrim. Reports the bactrim irritated his skin in genitale area, so he stopped it. Reports boil still leaks everyday.

## 2019-04-22 NOTE — ED Provider Notes (Signed)
[facility]   CSN: [id number] Arrival date & time: 04/22/19  0750     History Chief Complaint  Patient presents with  . Recurrent Skin Infections    Brian Gilbert is a 44 y.o. male.  HPI Brian Gilbert presents for evaluation of chronic drainage, left perianal area, present for 9 months.  Initially treated with I&D, with subsequent allergic reaction, rash on penis, consulted with urology.  Brian Gilbert reports that the rash resolved, but Brian Gilbert has persistent intermittent drainage and pain, in the area that was previously incised.  Brian Gilbert denies systemic symptoms including fever, vomiting, anorexia, diarrhea, constipation, weakness.  No prior similar problems.  There are no other known modifying factors.    Past Medical History:  Diagnosis Date  . Hypertension   . Seizures [facility]     Patient Active Problem List   Diagnosis Date Noted  . Acute pyelonephritis 07/30/2016  . Seizures (HCC) 07/30/2016  . Essential hypertension 07/30/2016    History reviewed. No pertinent surgical history.     Family History  Problem Relation Age of Onset  . COPD Maternal Grandmother   . Hypertension Maternal Grandmother     Social History   Tobacco Use  . Smoking status: Current Every Day Smoker  . Smokeless tobacco: Never Used  Substance Use Topics  . Alcohol use: No  . Drug use: Yes    Types: Marijuana    Comment: occasional    Home Medications Prior to Admission medications   Medication Sig Start Date End Date Taking? Authorizing Provider  omeprazole (PRILOSEC) 20 MG capsule Take 20 mg by mouth daily. 06/27/18  Yes [provider]  polyvinyl alcohol (LIQUIFILM TEARS) 1.4 % ophthalmic solution Place 1 drop into both eyes as needed for dry eyes.   Yes [provider]  acetaminophen (TYLENOL) 325 MG tablet Take 2 tablets (650 mg total) by mouth every 6 (six) hours as needed for mild pain (or Fever >/= 101). Patient not taking: Reported on  09/09/2016 08/04/16   Leroy Sea, MD  amoxicillin-clavulanate (AUGMENTIN) 875-125 MG tablet Take 1 tablet by mouth every 12 (twelve) hours. Patient not taking: Reported on 04/22/2019 08/23/18   Derwood Kaplan, MD  bisacodyl (DULCOLAX) 5 MG EC tablet Take 1 tablet (5 mg total) by mouth 2 (two) times daily as needed for mild constipation or moderate constipation. Patient not taking: Reported on 09/09/2016 08/04/16   Leroy Sea, MD  diphenhydrAMINE (BENADRYL) 25 mg capsule Take 1 capsule (25 mg total) by mouth every 6 (six) hours as needed for itching. Patient not taking: Reported on 04/22/2019 08/23/18   Derwood Kaplan, MD  docusate sodium (COLACE) 100 MG capsule Take 1 capsule (100 mg total) by mouth 2 (two) times daily. Patient not taking: Reported on 09/09/2016 08/04/16   Leroy Sea, MD  doxycycline (VIBRAMYCIN) 100 MG capsule Take 1 capsule (100 mg total) by mouth 2 (two) times daily. One po bid x 7 days 04/22/19   Mancel Bale, MD  HYDROcodone-acetaminophen (NORCO/VICODIN) 5-325 MG tablet Take 1 tablet by mouth every 6 (six) hours as needed for severe pain. Patient not taking: Reported on 04/22/2019 08/21/18   Antony Madura, PA-C  naproxen (NAPROSYN) 500 MG tablet Take 1 tablet (500 mg total) by mouth every 12 (twelve) hours as needed for mild pain or moderate pain. Patient not taking: Reported on 04/22/2019 08/21/18   Antony Madura, PA-C  predniSONE (DELTASONE) 10 MG tablet Take 5 tablets (50 mg total) by mouth daily.  Patient not taking: Reported on 04/22/2019 08/23/18   Derwood Kaplan, MD  saccharomyces boulardii (FLORASTOR) 250 MG capsule Take 1 capsule (250 mg total) by mouth 2 (two) times daily. Patient not taking: Reported on 09/09/2016 08/04/16   Leroy Sea, MD    Allergies    Penicillins and Bactrim [sulfamethoxazole-trimethoprim]  Review of Systems   Review of Systems  All other systems reviewed and are negative.   Physical Exam Updated Vital Signs BP 121/75 (BP  Location: Left Arm)   Pulse 72   Temp 98.1 F (36.7 C) (Oral)   Resp 16   SpO2 97%   Physical Exam Vitals and nursing note reviewed.  Constitutional:      General: Brian Gilbert is not in acute distress.    Appearance: Brian Gilbert is well-developed. Brian Gilbert is not ill-appearing, toxic-appearing or diaphoretic.  HENT:     Head: Normocephalic and atraumatic.     Right Ear: External ear normal.     Left Ear: External ear normal.  Eyes:     Conjunctiva/sclera: Conjunctivae normal.     Pupils: Pupils are equal, round, and reactive to light.  Neck:     Trachea: Phonation normal.  Cardiovascular:     Rate and Rhythm: Normal rate.  Pulmonary:     Effort: Pulmonary effort is normal.  Genitourinary:    Comments: Normal-appearing anus without external hemorrhoids.  There is a left perianal mass, extending toward the left buttocks, approximately 1.5 x 2 to 3 cm.  This area is discrete, and tender to palpation.  This mass terminates in an umbilicated mass, with some skin indention,  a cutaneous granulomatous polyp like mass, approximately 0.5 cm.  There is no active drainage, or bleeding. Musculoskeletal:        General: Normal range of motion.     Cervical back: Normal range of motion and neck supple.  Skin:    General: Skin is warm and dry.  Neurological:     Mental Status: Brian Gilbert is alert and oriented to person, place, and time.     Cranial Nerves: No cranial nerve deficit.     Sensory: No sensory deficit.     Motor: No abnormal muscle tone.     Coordination: Coordination normal.  Psychiatric:        Mood and Affect: Mood normal.        Behavior: Behavior normal.        Thought Content: Thought content normal.        Judgment: Judgment normal.     ED Results / Procedures / Treatments   Labs (all labs ordered are listed, but only abnormal results are displayed) Labs Reviewed - No data to display  EKG None  Radiology No results found.  Procedures Procedures (including critical care  time)  Medications Ordered in ED Medications - No data to display  ED Course  I have reviewed the triage vital signs and the nursing notes.  Pertinent labs & imaging results that were available during my care of the patient were reviewed by me and considered in my medical decision making (see chart for details).    MDM Rules/Calculators/A&P                       Patient Vitals for the past 24 hrs:  BP Temp Temp src Pulse Resp SpO2  04/22/19 0757 121/75 98.1 F (36.7 C) Oral 72 16 97 %    9:18 AM Reevaluation with update and discussion. After initial assessment and  treatment, an updated evaluation reveals no change in clinical status, findings discussed and questions answered. 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  No systemic symptoms.  No cellulitis.  No indication for acute intervention, surgical consultation in the emergency department or hospitalization.  Will give trial of antibiotic, doxycycline, to improve drainage, but patient is counseled that this will likely need surgical intervention.  Brian Gilbert is referred to colorectal surgery.  Demetri Goshert was evaluated in Emergency Department on 04/22/2019 for the symptoms described in the history of present illness. Brian Gilbert was evaluated in the context of the global COVID-19 pandemic, which necessitated consideration that the patient might be at risk for infection with the SARS-CoV-2 virus that causes COVID-19. Institutional protocols and algorithms that pertain to the evaluation of patients at risk for COVID-19 are in a state of rapid change based on information released by regulatory bodies including the CDC and federal and state organizations. These policies and algorithms were followed during the patient's care in the ED.   CRITICAL CARE-no Performed by: Daleen Bo   Nursing Notes Reviewed/ Care  Coordinated Applicable Imaging Reviewed Interpretation of Laboratory Data incorporated into ED treatment  The patient appears reasonably screened and/or stabilized for discharge and I doubt any other medical condition or other [facility] requiring further screening, evaluation, or treatment in the ED at this time prior to discharge.  Plan: Home Medications-OTC analgesia of choice; Home Treatments-warm soaks to improve discomfort; return here if the recommended treatment, does not improve the symptoms; Recommended follow up-general surgery follow-up if not improving.    Final Clinical Impression(s) / ED Diagnoses Final diagnoses:  Skin infection    Rx / DC Orders ED Discharge Orders         Ordered    doxycycline (VIBRAMYCIN) 100 MG capsule  2 times daily     04/22/19 0905           Daleen Bo, MD 04/22/19 [id number]

## 2019-04-22 NOTE — Discharge Instructions (Addendum)
The area of discomfort and drainage appears to be a chronic problem, with some infection present.  This may be a simple subcutaneous cyst, but does have the appearance of a fistula with drainage.  The latter problem is more difficult to treat and will require surgery.  Even a chronic infection typically needs surgery, to improve it.  Try taking the antibiotic to see if it helps.  Also soak in a warm bath, 2 or 3 times a day to help the discomfort.  Follow-up with a colorectal surgeon at [facility] surgery, if you continue to have problems.

## 2019-04-27 IMAGING — CT CT ABD-PEL WO/W CM
2 of 8 series · 11 of 46 positions shown, 17 images · IV contrast (iopamidol)
Comparison: CT 07/30/2016

CLINICAL DATA: *40 y.o. male with a past medical history
significant for seizures not on AED who presents with hematuria,
chills, right abdominal and R flank pain. Retroperitoneal
inflammation of along the ureters on recent CT

EXAM:
CT ABDOMEN AND PELVIS WITHOUT AND WITH CONTRAST
TECHNIQUE: Multidetector CT imaging of the abdomen and pelvis was performed
following the standard protocol before and following the bolus
administration of intravenous contrast.
CONTRAST:  100mL 2R8PWL-KSS IOPAMIDOL (2R8PWL-KSS) INJECTION 61%,
50mL 2R8PWL-KSS IOPAMIDOL (2R8PWL-KSS) INJECTION 61%

[Series 5: coronal pre · coronal · non-contrast · 0.76mm/px · 3 of 101 slices shown, 4 images]
[im 26/101  soft-tissue]
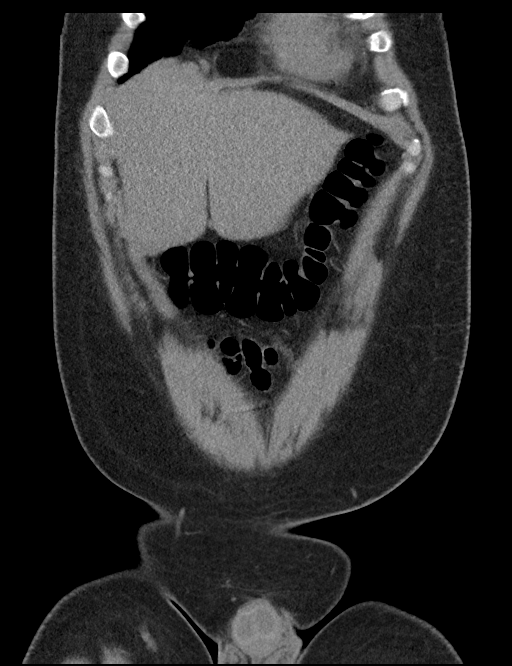
[im 51/101  soft-tissue]
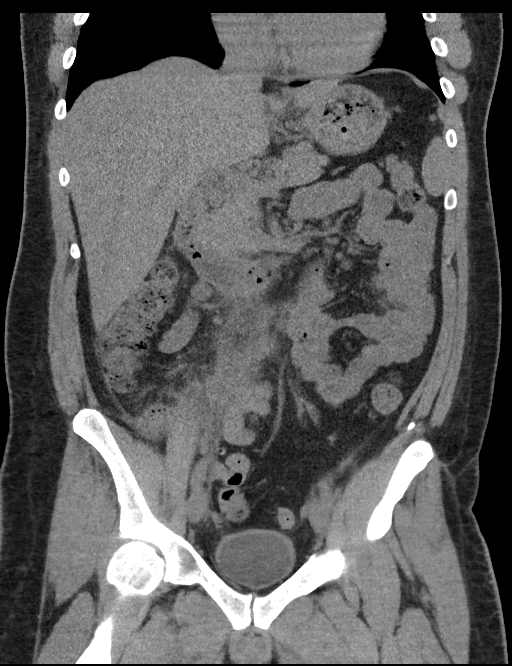
[im 51/101  bone]
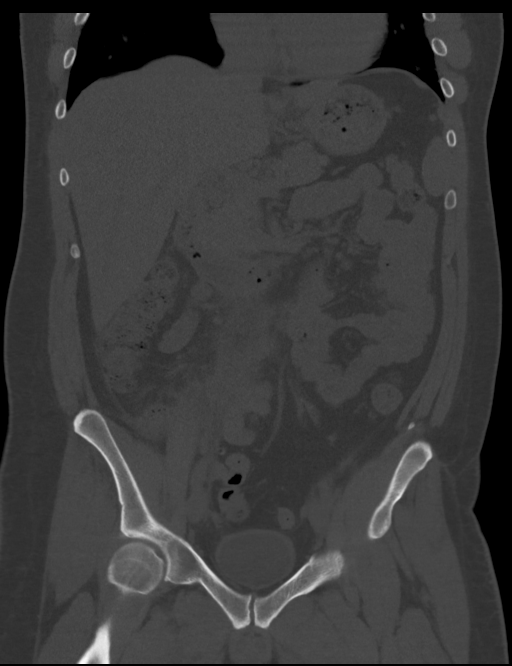
[im 76/101  soft-tissue]
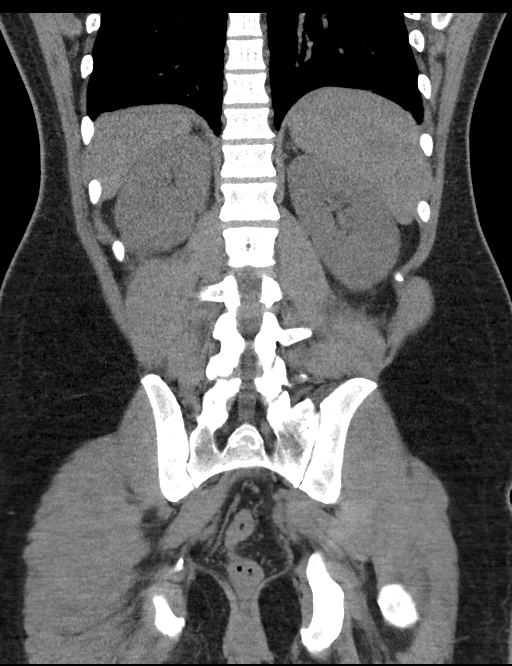

[Series 8: axial delay · axial · delayed · 0.84mm/px · z∈[-483,-83]mm · 8 of 104 slices shown, 13 images]
[im 12/104  soft-tissue]
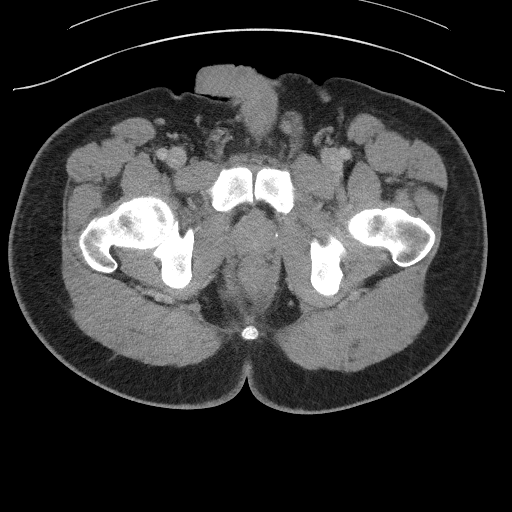
[im 12/104  bone]
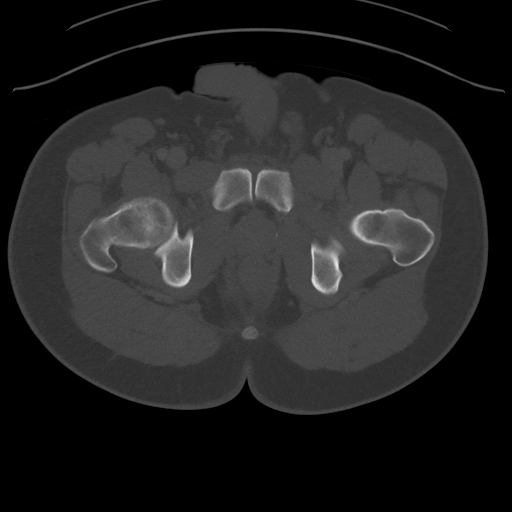
[im 23/104  soft-tissue]
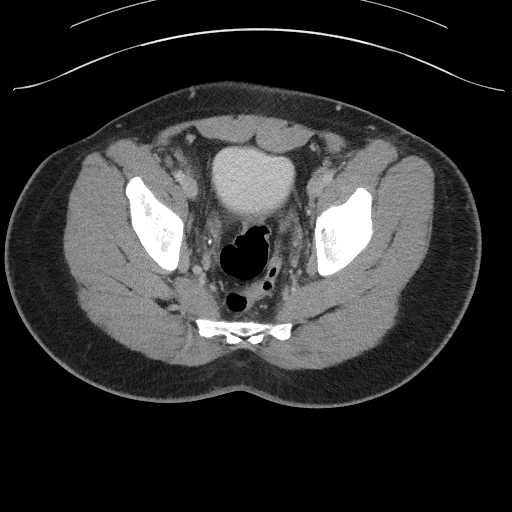
[im 35/104  soft-tissue]
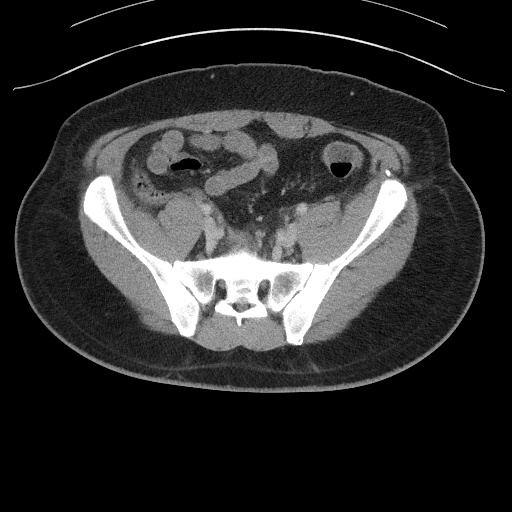
[im 46/104  soft-tissue]
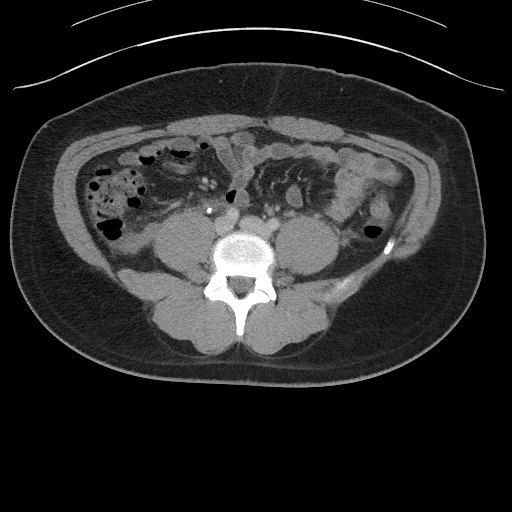
[im 58/104  soft-tissue]
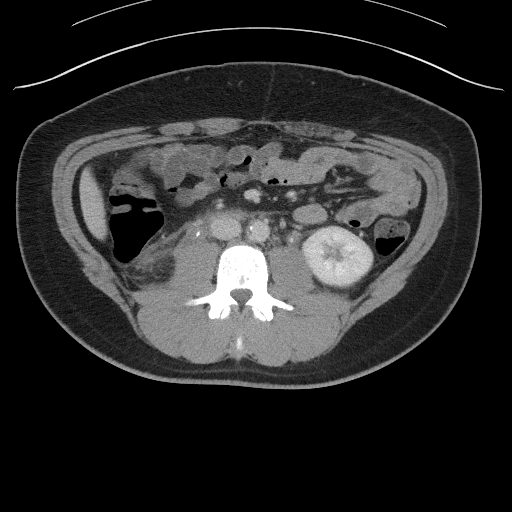
[im 58/104  lung]
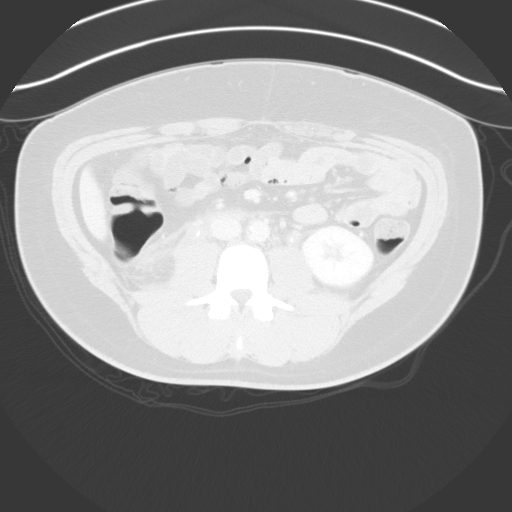
[im 69/104  soft-tissue]
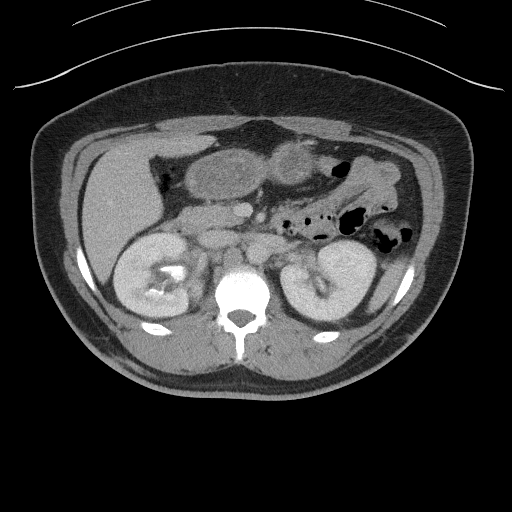
[im 69/104  lung]
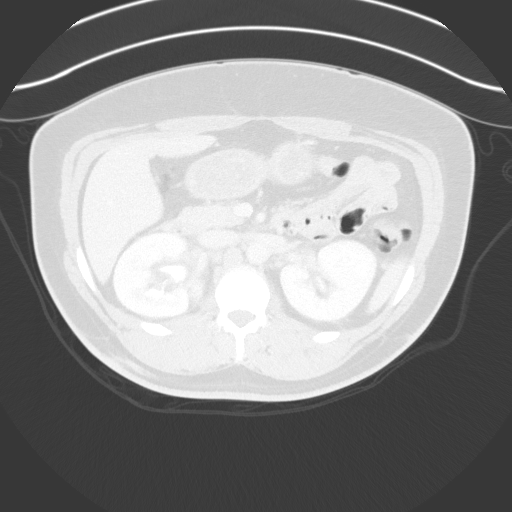
[im 81/104  soft-tissue]
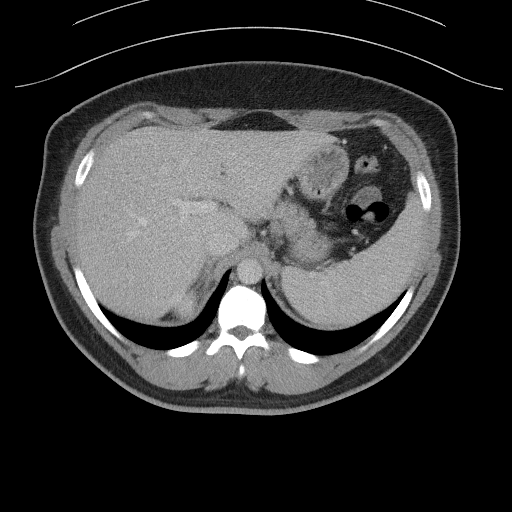
[im 81/104  lung]
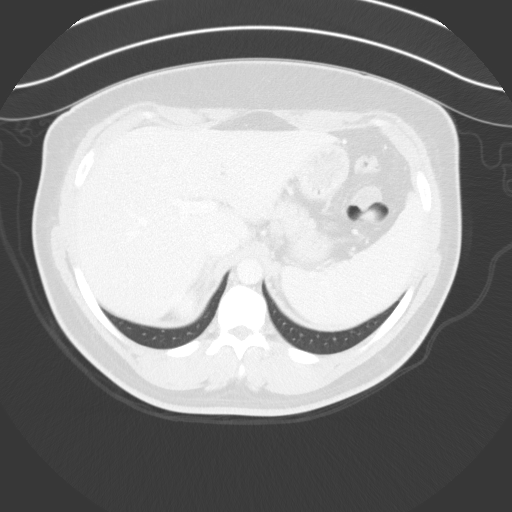
[im 92/104  soft-tissue]
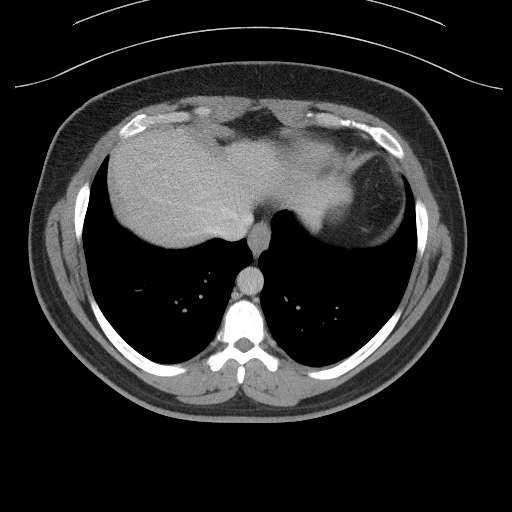
[im 92/104  lung]
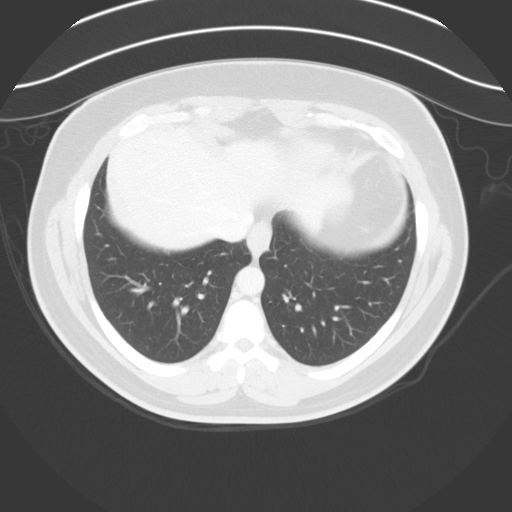

[11 of 46 positions shown; findings below may reference images not displayed]

FINDINGS: Lower chest: Lung bases are clear.

Hepatobiliary: No focal hepatic lesion. No biliary duct dilatation.
Gallbladder is normal. Common bile duct is normal.

Pancreas: Pancreas is normal. No ductal dilatation. No pancreatic
inflammation.

Spleen: Normal spleen

Adrenals/urinary tract: Adrenal glands normal. No nephrolithiasis or
ureterolithiasis.

Kidneys enhance symmetrically. There is thickening of LEFT and RIGHT
ureter (image 48, series 8 proximally. The more distal ureters
appear normal caliber.

The degree of inflammation surrounding the RIGHT ureter along the
psoas muscle is slightly improved compared to exam 4 days prior.

On Coronal imaging inflammation, appears to extend into the renal
pelvis with some narrowing of the calices (image 48, series 11).
Similar pattern expected on the LEFT but to a lesser degree.

On the coronal imaging there is a mild striated pattern within the
LEFT renal cortex (series 11).

No enhancing renal lesion. No measurable retroperitoneal adenopathy
or mass.

In the pelvis no filling defect within the bladder.

Stomach/Bowel: Stomach, small bowel, appendix, and cecum are normal.
The colon and rectosigmoid colon are normal.

Vascular/Lymphatic: Abdominal aorta is normal caliber. There is no
retroperitoneal or periportal lymphadenopathy. No pelvic
lymphadenopathy.

Reproductive: Prostate normal

Other: No free fluid.

Musculoskeletal: No aggressive osseous lesion.
IMPRESSION: 1. Improvement in retroperitoneal inflammation involving the LEFT
and RIGHT ureter (inflammation greater on the RIGHT).
2. Inflammation involves both the LEFT and RIGHT ureter and extends
into the renal pelves and calices.
3. Mild striated renal cortex enhancement pattern.

## 2019-05-05 ENCOUNTER — Encounter: Payer: Self-pay | Admitting: Surgery

## 2019-05-05 ENCOUNTER — Ambulatory Visit: Payer: Self-pay | Admitting: Surgery

## 2019-05-05 DIAGNOSIS — K603 Anal fistula, unspecified: Secondary | ICD-10-CM

## 2019-05-05 HISTORY — DX: Anal fistula: K60.3

## 2019-05-05 HISTORY — DX: Anal fistula, unspecified: K60.30

## 2019-05-05 NOTE — H&P (Signed)
Zackarie Chason Documented: 05/05/2019 10:12 AM Location: [facility] Patient #: [id number] DOB: July 17, 1975 Single / Language: [place] / Race: Asian Male  History of Present Illness [name] MD; 05/05/2019 12:38 PM) The patient is a 44 year old male who presents with anal fistula. Note for "Anal fistula": ` ` ` Patient sent for surgical consultation at the request of Dr Gray Bernhardt @ [facility] ED  Chief Complaint: Perianal drainage and pain. Concern for fistula ` ` The patient is a gentle history of anal pain and found to have an abscess. Incision and drainage in the emergency room 08/21/2018. Placed on Bactrim. He developed some blistering and a rash especially his penis. Switched off of that. Follow-up with urology. Blisters and rash healed up on his genitalia and elsewhere.   However, he is had intermittent anal pain and drainage since. Went to the emergency room most recently April 22, 2019. Dr. Effie Shy suspected patient had an anal fistula. Placed on antibiotics. Surgical consultation recommended. Patient usually moves his bowels on a daily basis. Can walk several miles without difficulty. He does smoke tobacco. No diabetes. He's had some issues with alcohol binge use that has tapered off. Had some loss of consciousness and seizures perhaps related to that. Not an issue now. No abdominal surgery. No personal nor family history of GI/colon cancer, inflammatory bowel disease, irritable bowel syndrome, allergy such as Celiac Sprue, dietary/dairy problems, colitis, ulcers nor gastritis. No recent sick contacts/gastroenteritis. No travel outside the country. No changes in diet. No dysphagia to solids or liquids. No significant heartburn or reflux. No hematochezia, hematemesis, coffee ground emesis. No evidence of prior gastric/peptic ulceration.   (Review of systems as stated in this history (HPI) or in the review of systems. Otherwise all other 12 point ROS  are negative) ` ` `  This patient encounter took 25 minutes today to perform the following: obtain history, perform exam, review outside records, interpret tests & imaging, counsel the patient on their diagnosis; and, document this encounter, including findings & plan in the electronic health record (EHR).   Diagnostic Studies History [name], CMA; 05/05/2019 10:12 AM) Colonoscopy never  Allergies ([name], CMA; 05/05/2019 10:13 AM) No Known Drug Allergies [05/05/2019]: Allergies Reconciled  Medication History ([name], CMA; 05/05/2019 10:13 AM) Omeprazole (20MG  Tablet DR, Oral) Active. Medications Reconciled  Social History , MD; 05/05/2019 10:49 AM) Alcohol use Remotely quit alcohol use. Caffeine use Carbonated beverages, Coffee, Tea. Illicit drug use Remotely quit drug use. Tobacco use Current every day smoker. He is from [place]/07[place], originally [place]. He is [place].  Other Problems [place], CMA; 05/05/2019 10:12 AM) Anxiety Disorder High blood pressure Seizure Disorder     Review of Systems ([name] CMA; 05/05/2019 10:12 AM) General Present- Fatigue. Not Present- Appetite Loss, Chills, Fever, Night Sweats, Weight Gain and Weight Loss. Skin Present- Rash. Not Present- Change in Wart/Mole, Dryness, Hives, Jaundice, New Lesions, Non-Healing Wounds and Ulcer. HEENT Present- Seasonal Allergies. Not Present- Earache, Hearing Loss, Hoarseness, Nose Bleed, Oral Ulcers, Ringing in the Ears, Sinus Pain, Sore Throat, Visual Disturbances, Wears glasses/contact lenses and Yellow Eyes. Respiratory Not Present- Bloody sputum, Chronic Cough, Difficulty Breathing, Snoring and Wheezing. Breast Not Present- Breast Mass, Breast Pain, Nipple Discharge and Skin Changes. Cardiovascular Not Present- Chest Pain, Difficulty Breathing Lying Down, Leg Cramps, Palpitations, Rapid Heart Rate, Shortness of  Breath and Swelling of Extremities. Gastrointestinal Not Present- Abdominal Pain, Bloating, Bloody Stool, Change in Bowel Habits, Chronic diarrhea,  Constipation, Difficulty Swallowing, Excessive gas, Gets full quickly at meals, Hemorrhoids, Indigestion, Nausea, Rectal Pain and Vomiting. Male Genitourinary Not Present- Blood in Urine, Change in Urinary Stream, Frequency, Impotence, Nocturia, Painful Urination, Urgency and Urine Leakage. Musculoskeletal Present- Back Pain and Joint Stiffness. Not Present- Joint Pain, Muscle Pain, Muscle Weakness and Swelling of Extremities. Neurological Not Present- Decreased Memory, Fainting, Headaches, Numbness, Seizures, Tingling, Tremor, Trouble walking and Weakness. Psychiatric Present- Change in Sleep Pattern. Not Present- Anxiety, Bipolar, Depression, Fearful and Frequent crying. Endocrine Not Present- Cold Intolerance, Excessive Hunger, Hair Changes, Heat Intolerance, Hot flashes and New Diabetes. Hematology Not Present- Blood Thinners, Easy Bruising, Excessive bleeding, Gland problems, HIV and Persistent Infections.  Vitals ([name] CMA; 05/05/2019 10:14 AM) 05/05/2019 10:13 AM Weight: 240 lb Height: 72in Body Surface Area: 2.3 m Body Mass Index: 32.55 kg/m  Temp.: 97.24F(Oral)  Pulse: 78 (Regular)  BP: 134/84 (Sitting, Left Arm, Standard)        Physical Exam [name] MD; 05/05/2019 10:50 AM)  General Mental Status-Alert. General Appearance-Not in acute distress, Not Sickly. Orientation-Oriented X3. Hydration-Well hydrated. Voice-Normal.  Integumentary Global Assessment Upon inspection and palpation of skin surfaces of the - Axillae: non-tender, no inflammation or ulceration, no drainage. and Distribution of scalp and body hair is normal. General Characteristics Temperature - normal warmth is noted.  Head and Neck Head-normocephalic, atraumatic with no lesions or palpable masses. Face Global  Assessment - atraumatic, no absence of expression. Neck Global Assessment - no abnormal movements, no bruit auscultated on the right, no bruit auscultated on the left, no decreased range of motion, non-tender. Trachea-midline. Thyroid Gland Characteristics - non-tender.  Eye Eyeball - Left-Extraocular movements intact, No Nystagmus - Left. Eyeball - Right-Extraocular movements intact, No Nystagmus - Right. Cornea - Left-No Hazy - Left. Cornea - Right-No Hazy - Right. Sclera/Conjunctiva - Left-No scleral icterus, No Discharge - Left. Sclera/Conjunctiva - Right-No scleral icterus, No Discharge - Right. Pupil - Left-Direct reaction to light normal. Pupil - Right-Direct reaction to light normal.  ENMT Ears Pinna - Left - no drainage observed, no generalized tenderness observed. Pinna - Right - no drainage observed, no generalized tenderness observed. Nose and Sinuses External Inspection of the Nose - no destructive lesion observed. Inspection of the nares - Left - quiet respiration. Inspection of the nares - Right - quiet respiration. Mouth and Throat Lips - Upper Lip - no fissures observed, no pallor noted. Lower Lip - no fissures observed, no pallor noted. Nasopharynx - no discharge present. Oral Cavity/Oropharynx - Tongue - no dryness observed. Oral Mucosa - no cyanosis observed. Hypopharynx - no evidence of airway distress observed.  Chest and Lung Exam Inspection Movements - Normal and Symmetrical. Accessory muscles - No use of accessory muscles in breathing. Palpation Palpation of the chest reveals - Non-tender. Auscultation Breath sounds - Normal and Clear.  Cardiovascular Auscultation Rhythm - Regular. Murmurs & Other Heart Sounds - Auscultation of the heart reveals - No Murmurs and No Systolic Clicks.  Abdomen Inspection Inspection of the abdomen reveals - No Visible peristalsis and No Abnormal pulsations. Umbilicus - No Bleeding, No Urine  drainage. Palpation/Percussion Palpation and Percussion of the abdomen reveal - Soft, Non Tender, No Rebound tenderness, No Rigidity (guarding) and No Cutaneous hyperesthesia. Note: Abdomen soft. Nontender. Not distended. No umbilical or incisional hernias. No guarding.  Male Genitourinary Sexual Maturity Tanner 5 - Adult hair pattern and Adult penile size and shape. Note: No inguinal hernias. Normal external genitalia. No sores blisters or lesions. No  condyloma. Epididymi, testes, and spermatic cords normal without any masses.  Rectal Note: Left anterior sinus with some firmness consistent with a perirectal fistula. I can feel a cord heading towards the sphincter. No undrained abscess. Mild posterior anal tags. Mildly increased sphincter tone but no obvious fissure. No other hemorrhoids. I held off on any more aggressive internal exam  Peripheral Vascular Upper Extremity Inspection - Left - No Cyanotic nailbeds - Left, Not Ischemic. Inspection - Right - No Cyanotic nailbeds - Right, Not Ischemic.  Neurologic Neurologic evaluation reveals -normal attention span and ability to concentrate, able to name objects and repeat phrases. Appropriate fund of knowledge , normal sensation and normal coordination. Mental Status Affect - not angry, not paranoid. Cranial Nerves-Normal Bilaterally. Gait-Normal.  Neuropsychiatric Mental status exam performed with findings of-able to articulate well with normal speech/language, rate, volume and coherence, thought content normal with ability to perform basic computations and apply abstract reasoning and no evidence of hallucinations, delusions, obsessions or homicidal/suicidal ideation.  Musculoskeletal Global Assessment Spine, Ribs and Pelvis - no instability, subluxation or laxity. Right Upper Extremity - no instability, subluxation or laxity.  Lymphatic Head & Neck  General Head & Neck Lymphatics: Bilateral - Description - No  Localized lymphadenopathy. Axillary  General Axillary Region: Bilateral - Description - No Localized lymphadenopathy. Femoral & Inguinal  Generalized Femoral & Inguinal Lymphatics: Left - Description - No Localized lymphadenopathy. Right - Description - No Localized lymphadenopathy.    Assessment & Plan Adin Hector MD; 05/05/2019 12:38 PM)  ANAL FISTULA (K60.3) Impression: History of anal abscess with persistent intermittent drainage. Story and exam classic for anal fistula. I think he would benefit from examination anesthesia. Probable LIFT fistula repair versus seton & staged repair. We will see.  I strongly encouraged him to quit smoking as that raises the likelihood of recurrent infections, surgical failure, and other problems.  Current Plans Pt Education - [facility] Abscess/Fistula (AT): discussed with patient and provided information. The anatomy & physiology of the anorectal region was discussed. We discussed the pathophysiology of anorectal abscess and fistula. Differential diagnosis was discussed. Natural history progression was discussed. I stressed the importance of a bowel regimen to have daily soft bowel movements to minimize progression of disease.  The patient's condition is not adequately controlled. Non-operative treatment has not healed the fistula. Therefore, I recommended examination under anaesthesia to confirm the diagnosis and treat the fistula. I discussed techniques that may be required such as fistulotomy, ligation by LIFT technique, and/or seton placement. Benefits & alternatives discussed. I noted a good likelihood this will help address the problem, but sometimes repeat operations and prolonged healing times may occur. Risks such as bleeding, pain, recurrence, reoperation, incontinence, heart attack, death, and other risks were discussed.  Educational handouts further explaining the pathology, treatment options, and bowel regimen were given. The  patient expressed understanding & wishes to proceed. We will work to coordinate surgery for a mutually convenient time.   ENCOUNTER FOR PREOPERATIVE EXAMINATION FOR GENERAL SURGICAL PROCEDURE (Z01.818)  Current Plans You are being scheduled for surgery- Our schedulers will call you.  You should hear from our office's scheduling department within 5 working days about the location, date, and time of surgery. We try to make accommodations for patient's preferences in scheduling surgery, but sometimes the OR schedule or the surgeon's schedule prevents [place] from making those accommodations.  If you have not heard from our office [phone]) in 5 working days, call the office and ask for your surgeon's nurse.  If you have other questions about your diagnosis, plan, or surgery, call the office and ask for your surgeon's nurse.  Pt Education - [facility] Rectal Prep for Anorectal outpatient/office surgery: discussed with patient and provided information. Pt Education - [facility] Rectal Surgery HCI (Keona Bilyeu): discussed with patient and provided information. Pt Education - [facility] Good Bowel Health (Fredderick Swanger)  TOBACCO ABUSE (Z72.0) Impression: STOP SMOKING!  We talked to the patient about the dangers of smoking. We stressed that tobacco use dramatically increases the risk of peri-operative complications such as infection, tissue necrosis leaving to problems with incision/wound and organ healing, hernia, chronic pain, heart attack, stroke, DVT, pulmonary embolism, and death. We noted there are programs in our community to help stop smoking. Information was available.  Current Plans Pt Education - CCS STOP SMOKING!  [name], MD, FACS, MASCRS Gastrointestinal and Minimally Invasive Surgery  [facility] [address], [address] [place], [place] [address] [phone] Main / Paging [phone] Fax

## 2019-06-11 DIAGNOSIS — K219 Gastro-esophageal reflux disease without esophagitis: Secondary | ICD-10-CM | POA: Insufficient documentation

## 2019-06-11 DIAGNOSIS — E669 Obesity, unspecified: Secondary | ICD-10-CM | POA: Insufficient documentation

## 2019-06-11 DIAGNOSIS — Z8042 Family history of malignant neoplasm of prostate: Secondary | ICD-10-CM | POA: Insufficient documentation

## 2019-07-16 ENCOUNTER — Other Ambulatory Visit: Payer: Self-pay

## 2019-07-16 ENCOUNTER — Encounter (HOSPITAL_BASED_OUTPATIENT_CLINIC_OR_DEPARTMENT_OTHER): Payer: Self-pay | Admitting: Surgery

## 2019-07-16 NOTE — Progress Notes (Signed)
Spoke with: Brian Gilbert, Brian Gilbert, Brian Gilbert, Brian Gilbert, Brian Gilbert) [phone] Brian Elie Confer (brother) [phone]  Patient verbalized understanding of instructions that were given at this phone interview. Patient denies shortness of breath, chest pain, fever, cough a this phone interview.

## 2019-07-20 ENCOUNTER — Other Ambulatory Visit (HOSPITAL_COMMUNITY)
Admission: RE | Admit: 2019-07-20 | Discharge: 2019-07-20 | Disposition: A | Discharge status: Home / Self Care | Payer: Medicaid Other | Source: Ambulatory Visit | Attending: Surgery | Admitting: Surgery

## 2019-07-20 DIAGNOSIS — Z20822 Contact with and (suspected) exposure to covid-19: Secondary | ICD-10-CM | POA: Diagnosis not present

## 2019-07-20 DIAGNOSIS — Z01812 Encounter for preprocedural laboratory examination: Secondary | ICD-10-CM | POA: Diagnosis present

## 2019-07-20 LAB — SARS CORONAVIRUS 2 (TAT 6-24 HRS): SARS Coronavirus 2: NEGATIVE

## 2019-07-22 NOTE — Anesthesia Preprocedure Evaluation (Addendum)
Anesthesia Evaluation  Patient identified by MRN, date of birth, ID band Patient awake    Reviewed: Allergy & Precautions, NPO status , Patient's Chart, lab work & pertinent test results  History of Anesthesia Complications Negative for: history of anesthetic complications  Airway Mallampati: II  TM Distance: >3 FB Neck ROM: Full    Dental no notable dental hx. (+) Dental Advisory Given   Pulmonary Current SmokerPatient did not abstain from smoking.,    Pulmonary exam normal        Cardiovascular hypertension, Normal cardiovascular exam     Neuro/Psych Seizures -,     GI/Hepatic Neg liver ROS, GERD  Medicated,  Endo/Other  negative endocrine ROS  Renal/GU negative Renal ROS     Musculoskeletal negative musculoskeletal ROS (+)   Abdominal   Peds  Hematology negative hematology ROS (+)   Anesthesia Other Findings   Reproductive/Obstetrics                           Anesthesia Physical Anesthesia Plan  ASA: II  Anesthesia Plan: General   Post-op Pain Management:    Induction: Intravenous  PONV Risk Score and Plan: 2 and Ondansetron and Dexamethasone  Airway Management Planned: Oral ETT  Additional Equipment: None  Intra-op Plan:   Post-operative Plan: Extubation in OR  Informed Consent: I have reviewed the patients History and Physical, chart, labs and discussed the procedure including the risks, benefits and alternatives for the proposed anesthesia with the patient or authorized representative who has indicated his/her understanding and acceptance.     Dental advisory given  Plan Discussed with: Anesthesiologist  Anesthesia Plan Comments:        Anesthesia Quick Evaluation

## 2019-07-23 ENCOUNTER — Other Ambulatory Visit: Payer: Self-pay

## 2019-07-23 ENCOUNTER — Encounter (HOSPITAL_BASED_OUTPATIENT_CLINIC_OR_DEPARTMENT_OTHER)
Admission: RE | Disposition: A | Discharge status: Home / Self Care | Payer: Self-pay | Source: Home / Self Care | Attending: Surgery

## 2019-07-23 ENCOUNTER — Encounter (HOSPITAL_BASED_OUTPATIENT_CLINIC_OR_DEPARTMENT_OTHER): Payer: Self-pay | Admitting: Surgery

## 2019-07-23 ENCOUNTER — Ambulatory Visit (HOSPITAL_BASED_OUTPATIENT_CLINIC_OR_DEPARTMENT_OTHER): Payer: Medicaid Other | Admitting: Anesthesiology

## 2019-07-23 ENCOUNTER — Ambulatory Visit (HOSPITAL_BASED_OUTPATIENT_CLINIC_OR_DEPARTMENT_OTHER)
Admission: RE | Admit: 2019-07-23 | Discharge: 2019-07-23 | Disposition: A | Discharge status: Home / Self Care | Payer: Medicaid Other | Attending: Surgery | Admitting: Surgery

## 2019-07-23 DIAGNOSIS — Z791 Long term (current) use of non-steroidal anti-inflammatories (NSAID): Secondary | ICD-10-CM | POA: Diagnosis not present

## 2019-07-23 DIAGNOSIS — Z825 Family history of asthma and other chronic lower respiratory diseases: Secondary | ICD-10-CM | POA: Insufficient documentation

## 2019-07-23 DIAGNOSIS — T8859XA Other complications of anesthesia, initial encounter: Secondary | ICD-10-CM

## 2019-07-23 DIAGNOSIS — Z888 Allergy status to other drugs, medicaments and biological substances status: Secondary | ICD-10-CM | POA: Insufficient documentation

## 2019-07-23 DIAGNOSIS — Z880 Allergy status to penicillin: Secondary | ICD-10-CM | POA: Insufficient documentation

## 2019-07-23 DIAGNOSIS — K219 Gastro-esophageal reflux disease without esophagitis: Secondary | ICD-10-CM | POA: Diagnosis not present

## 2019-07-23 DIAGNOSIS — F419 Anxiety disorder, unspecified: Secondary | ICD-10-CM | POA: Insufficient documentation

## 2019-07-23 DIAGNOSIS — Z882 Allergy status to sulfonamides status: Secondary | ICD-10-CM | POA: Diagnosis not present

## 2019-07-23 DIAGNOSIS — F17200 Nicotine dependence, unspecified, uncomplicated: Secondary | ICD-10-CM | POA: Insufficient documentation

## 2019-07-23 DIAGNOSIS — K614 Intrasphincteric abscess: Secondary | ICD-10-CM | POA: Diagnosis present

## 2019-07-23 DIAGNOSIS — Z79899 Other long term (current) drug therapy: Secondary | ICD-10-CM | POA: Diagnosis not present

## 2019-07-23 DIAGNOSIS — G40909 Epilepsy, unspecified, not intractable, without status epilepticus: Secondary | ICD-10-CM | POA: Diagnosis not present

## 2019-07-23 DIAGNOSIS — F17210 Nicotine dependence, cigarettes, uncomplicated: Secondary | ICD-10-CM | POA: Insufficient documentation

## 2019-07-23 DIAGNOSIS — K604 Rectal fistula: Secondary | ICD-10-CM | POA: Diagnosis not present

## 2019-07-23 DIAGNOSIS — Z8614 Personal history of Methicillin resistant Staphylococcus aureus infection: Secondary | ICD-10-CM | POA: Insufficient documentation

## 2019-07-23 DIAGNOSIS — Z7952 Long term (current) use of systemic steroids: Secondary | ICD-10-CM | POA: Diagnosis not present

## 2019-07-23 DIAGNOSIS — K5909 Other constipation: Secondary | ICD-10-CM | POA: Insufficient documentation

## 2019-07-23 DIAGNOSIS — K603 Anal fistula, unspecified: Secondary | ICD-10-CM | POA: Diagnosis present

## 2019-07-23 DIAGNOSIS — Z881 Allergy status to other antibiotic agents status: Secondary | ICD-10-CM | POA: Diagnosis not present

## 2019-07-23 DIAGNOSIS — I10 Essential (primary) hypertension: Secondary | ICD-10-CM | POA: Diagnosis not present

## 2019-07-23 DIAGNOSIS — Z8249 Family history of ischemic heart disease and other diseases of the circulatory system: Secondary | ICD-10-CM | POA: Diagnosis not present

## 2019-07-23 DIAGNOSIS — I4510 Unspecified right bundle-branch block: Secondary | ICD-10-CM | POA: Insufficient documentation

## 2019-07-23 DIAGNOSIS — K641 Second degree hemorrhoids: Secondary | ICD-10-CM | POA: Diagnosis not present

## 2019-07-23 HISTORY — PX: FISTULOTOMY: SHX6413

## 2019-07-23 HISTORY — PX: EVALUATION UNDER ANESTHESIA WITH HEMORRHOIDECTOMY: SHX5624

## 2019-07-23 HISTORY — DX: Gastro-esophageal reflux disease without esophagitis: K21.9

## 2019-07-23 LAB — POCT I-STAT, CHEM 8
BUN: 15 mg/dL (ref 6–20)
Calcium, Ion: 1.32 mmol/L (ref 1.15–1.40)
Chloride: 104 mmol/L (ref 98–111)
Creatinine, Ser: 0.8 mg/dL (ref 0.61–1.24)
Glucose, Bld: 104 mg/dL — ABNORMAL HIGH (ref 70–99)
HCT: 48 % (ref 39.0–52.0)
Hemoglobin: 16.3 g/dL (ref 13.0–17.0)
Potassium: 4.1 mmol/L (ref 3.5–5.1)
Sodium: 139 mmol/L (ref 135–145)
TCO2: 25 mmol/L (ref 22–32)

## 2019-07-23 SURGERY — EXAM UNDER ANESTHESIA WITH HEMORRHOIDECTOMY
Anesthesia: General | Site: Rectum

## 2019-07-23 MED ORDER — ACETAMINOPHEN 500 MG PO TABS: 1000.0000 mg | ORAL_TABLET | Freq: Once | ORAL | Status: DC

## 2019-07-23 MED ORDER — BUPIVACAINE-EPINEPHRINE 0.25% -1:200000 IJ SOLN
INTRAMUSCULAR | Status: DC | PRN
  Administered 2019-07-23: 20 mL

## 2019-07-23 MED ORDER — CLINDAMYCIN PHOSPHATE 900 MG/50ML IV SOLN
INTRAVENOUS | Status: AC
  Filled 2019-07-23: qty 50

## 2019-07-23 MED ORDER — LIDOCAINE HCL (CARDIAC) PF 100 MG/5ML IV SOSY
PREFILLED_SYRINGE | INTRAVENOUS | Status: DC | PRN
  Administered 2019-07-23: 100 mg via INTRAVENOUS

## 2019-07-23 MED ORDER — FENTANYL CITRATE (PF) 250 MCG/5ML IJ SOLN
INTRAMUSCULAR | Status: AC
  Filled 2019-07-23: qty 5

## 2019-07-23 MED ORDER — HYDROCODONE-ACETAMINOPHEN 10-325 MG PO TABS: 1.0000 | ORAL_TABLET | Freq: Four times a day (QID) | ORAL | 0 refills | Status: DC | PRN

## 2019-07-23 MED ORDER — LIDOCAINE 2% (20 MG/ML) 5 ML SYRINGE
INTRAMUSCULAR | Status: AC
  Filled 2019-07-23: qty 5

## 2019-07-23 MED ORDER — MIDAZOLAM HCL 2 MG/2ML IJ SOLN
INTRAMUSCULAR | Status: AC
  Filled 2019-07-23: qty 2

## 2019-07-23 MED ORDER — LACTATED RINGERS IV SOLN: INTRAVENOUS | Status: DC

## 2019-07-23 MED ORDER — BUPIVACAINE LIPOSOME 1.3 % IJ SUSP
INTRAMUSCULAR | Status: DC | PRN
  Administered 2019-07-23: 20 mL

## 2019-07-23 MED ORDER — CELECOXIB 200 MG PO CAPS
ORAL_CAPSULE | ORAL | Status: AC
  Filled 2019-07-23: qty 1

## 2019-07-23 MED ORDER — MIDAZOLAM HCL 5 MG/5ML IJ SOLN
INTRAMUSCULAR | Status: DC | PRN
  Administered 2019-07-23 (×2): 1 mg via INTRAVENOUS

## 2019-07-23 MED ORDER — DEXAMETHASONE SODIUM PHOSPHATE 10 MG/ML IJ SOLN
INTRAMUSCULAR | Status: AC
  Filled 2019-07-23: qty 1

## 2019-07-23 MED ORDER — CELECOXIB 200 MG PO CAPS
400.0000 mg | ORAL_CAPSULE | ORAL | Status: AC
  Administered 2019-07-23: 400 mg via ORAL

## 2019-07-23 MED ORDER — BUPIVACAINE LIPOSOME 1.3 % IJ SUSP: 20.0000 mL | Freq: Once | INTRAMUSCULAR | Status: DC

## 2019-07-23 MED ORDER — CLINDAMYCIN PHOSPHATE 900 MG/50ML IV SOLN
900.0000 mg | INTRAVENOUS | Status: AC
  Administered 2019-07-23: 900 mg via INTRAVENOUS

## 2019-07-23 MED ORDER — FENTANYL CITRATE (PF) 100 MCG/2ML IJ SOLN
INTRAMUSCULAR | Status: AC
  Filled 2019-07-23: qty 2

## 2019-07-23 MED ORDER — OXYCODONE HCL 5 MG PO TABS
5.0000 mg | ORAL_TABLET | Freq: Once | ORAL | Status: AC
  Administered 2019-07-23: 5 mg via ORAL

## 2019-07-23 MED ORDER — CHLORHEXIDINE GLUCONATE CLOTH 2 % EX PADS: 6.0000 | MEDICATED_PAD | Freq: Once | CUTANEOUS | Status: DC

## 2019-07-23 MED ORDER — METHYLENE BLUE 0.5 % INJ SOLN
INTRAVENOUS | Status: DC | PRN
  Administered 2019-07-23: 5 mL via SUBMUCOSAL

## 2019-07-23 MED ORDER — GABAPENTIN 300 MG PO CAPS
300.0000 mg | ORAL_CAPSULE | ORAL | Status: AC
  Administered 2019-07-23: 300 mg via ORAL

## 2019-07-23 MED ORDER — ONDANSETRON HCL 4 MG/2ML IJ SOLN
INTRAMUSCULAR | Status: DC | PRN
  Administered 2019-07-23: 4 mg via INTRAVENOUS

## 2019-07-23 MED ORDER — ACETAMINOPHEN 500 MG PO TABS
ORAL_TABLET | ORAL | Status: AC
  Filled 2019-07-23: qty 2

## 2019-07-23 MED ORDER — PROPOFOL 10 MG/ML IV BOLUS
INTRAVENOUS | Status: AC
  Filled 2019-07-23: qty 20

## 2019-07-23 MED ORDER — ROCURONIUM BROMIDE 10 MG/ML (PF) SYRINGE
PREFILLED_SYRINGE | INTRAVENOUS | Status: AC
  Filled 2019-07-23: qty 10

## 2019-07-23 MED ORDER — DIBUCAINE (PERIANAL) 1 % EX OINT
TOPICAL_OINTMENT | CUTANEOUS | Status: DC | PRN
  Administered 2019-07-23: 1 via RECTAL

## 2019-07-23 MED ORDER — ACETAMINOPHEN 500 MG PO TABS
1000.0000 mg | ORAL_TABLET | ORAL | Status: AC
  Administered 2019-07-23: 1000 mg via ORAL

## 2019-07-23 MED ORDER — FENTANYL CITRATE (PF) 100 MCG/2ML IJ SOLN
INTRAMUSCULAR | Status: DC | PRN
  Administered 2019-07-23: 100 ug via INTRAVENOUS
  Administered 2019-07-23 (×2): 50 ug via INTRAVENOUS

## 2019-07-23 MED ORDER — PROPOFOL 10 MG/ML IV BOLUS
INTRAVENOUS | Status: DC | PRN
  Administered 2019-07-23: 200 mg via INTRAVENOUS

## 2019-07-23 MED ORDER — FENTANYL CITRATE (PF) 100 MCG/2ML IJ SOLN
25.0000 ug | INTRAMUSCULAR | Status: DC | PRN
  Administered 2019-07-23 (×3): 25 ug via INTRAVENOUS

## 2019-07-23 MED ORDER — ONDANSETRON HCL 4 MG/2ML IJ SOLN
INTRAMUSCULAR | Status: AC
  Filled 2019-07-23: qty 2

## 2019-07-23 MED ORDER — SUGAMMADEX SODIUM 200 MG/2ML IV SOLN
INTRAVENOUS | Status: DC | PRN
  Administered 2019-07-23: 300 mg via INTRAVENOUS

## 2019-07-23 MED ORDER — ROCURONIUM BROMIDE 100 MG/10ML IV SOLN
INTRAVENOUS | Status: DC | PRN
  Administered 2019-07-23: 50 mg via INTRAVENOUS
  Administered 2019-07-23: 20 mg via INTRAVENOUS

## 2019-07-23 MED ORDER — PROMETHAZINE HCL 25 MG/ML IJ SOLN: 6.2500 mg | INTRAMUSCULAR | Status: DC | PRN

## 2019-07-23 MED ORDER — GENTAMICIN SULFATE 40 MG/ML IJ SOLN
5.0000 mg/kg | INTRAVENOUS | Status: AC
  Administered 2019-07-23: 440 mg via INTRAVENOUS
  Filled 2019-07-23: qty 11

## 2019-07-23 MED ORDER — OXYCODONE HCL 5 MG PO TABS
ORAL_TABLET | ORAL | Status: AC
  Filled 2019-07-23: qty 1

## 2019-07-23 MED ORDER — DEXAMETHASONE SODIUM PHOSPHATE 10 MG/ML IJ SOLN
INTRAMUSCULAR | Status: DC | PRN
  Administered 2019-07-23: 10 mg via INTRAVENOUS

## 2019-07-23 MED ORDER — CELECOXIB 200 MG PO CAPS: 200.0000 mg | ORAL_CAPSULE | Freq: Once | ORAL | Status: DC

## 2019-07-23 MED ORDER — GABAPENTIN 300 MG PO CAPS
ORAL_CAPSULE | ORAL | Status: AC
  Filled 2019-07-23: qty 1

## 2019-07-23 SURGICAL SUPPLY — 62 items
BENZOIN TINCTURE PRP APPL 2/3 (GAUZE/BANDAGES/DRESSINGS) ×3 IMPLANT
BLADE HEX COATED 2.75 (ELECTRODE) ×3 IMPLANT
BLADE SURG 10 STRL SS (BLADE) IMPLANT
BLADE SURG 15 STRL LF DISP TIS (BLADE) ×1 IMPLANT
BLADE SURG 15 STRL SS (BLADE) ×3
BRIEF STRETCH FOR OB PAD LRG (UNDERPADS AND DIAPERS) ×3 IMPLANT
CANISTER SUCT 1200ML W/VALVE (MISCELLANEOUS) ×3 IMPLANT
COVER BACK TABLE 60X90IN (DRAPES) ×3 IMPLANT
COVER MAYO STAND STRL (DRAPES) ×3 IMPLANT
COVER WAND RF STERILE (DRAPES) ×3 IMPLANT
DECANTER SPIKE VIAL GLASS SM (MISCELLANEOUS) ×3 IMPLANT
DRAPE HYSTEROSCOPY (DRAPE) IMPLANT
DRAPE LAPAROTOMY 100X72 PEDS (DRAPES) ×3 IMPLANT
DRAPE SHEET LG 3/4 BI-LAMINATE (DRAPES) IMPLANT
DRAPE UTILITY XL STRL (DRAPES) ×3 IMPLANT
DRSG PAD ABDOMINAL 8X10 ST (GAUZE/BANDAGES/DRESSINGS) ×3 IMPLANT
ELECT NEEDLE TIP 2.8 STRL (NEEDLE) IMPLANT
ELECT REM PT RETURN 9FT ADLT (ELECTROSURGICAL) ×3
ELECTRODE REM PT RTRN 9FT ADLT (ELECTROSURGICAL) ×1 IMPLANT
FILTER STRAW (MISCELLANEOUS) IMPLANT
GAUZE SPONGE 4X4 12PLY STRL (GAUZE/BANDAGES/DRESSINGS) ×3 IMPLANT
GLOVE BIOGEL PI IND STRL 6.5 (GLOVE) ×2 IMPLANT
GLOVE BIOGEL PI IND STRL 7.5 (GLOVE) ×1 IMPLANT
GLOVE BIOGEL PI INDICATOR 6.5 (GLOVE) ×4
GLOVE BIOGEL PI INDICATOR 7.5 (GLOVE) ×2
GLOVE ECLIPSE 8.0 STRL XLNG CF (GLOVE) ×3 IMPLANT
GLOVE INDICATOR 8.0 STRL GRN (GLOVE) ×3 IMPLANT
GLOVE SURG SS PI 6.5 STRL IVOR (GLOVE) ×6 IMPLANT
GOWN STRL REUS W/TWL LRG LVL3 (GOWN DISPOSABLE) ×6 IMPLANT
GOWN STRL REUS W/TWL XL LVL3 (GOWN DISPOSABLE) ×3 IMPLANT
IV CATH PLACEMENT 20 GA (IV SOLUTION) ×6 IMPLANT
KIT SIGMOIDOSCOPE (SET/KITS/TRAYS/PACK) IMPLANT
KIT TURNOVER CYSTO (KITS) ×3 IMPLANT
LEGGING LITHOTOMY PAIR STRL (DRAPES) IMPLANT
NEEDLE HYPO 22GX1.5 SAFETY (NEEDLE) ×3 IMPLANT
NS IRRIG 500ML POUR BTL (IV SOLUTION) ×3 IMPLANT
PAD PREP 24X48 CUFFED NSTRL (MISCELLANEOUS) IMPLANT
PENCIL BUTTON HOLSTER BLD 10FT (ELECTRODE) ×3 IMPLANT
SCRUB TECHNI CARE 4 OZ NO DYE (MISCELLANEOUS) ×3 IMPLANT
SET BASIN DAY SURGERY F.S. (CUSTOM PROCEDURE TRAY) ×3 IMPLANT
SHEARS HARMONIC 9CM CVD (BLADE) IMPLANT
SURGILUBE 2OZ TUBE FLIPTOP (MISCELLANEOUS) ×3 IMPLANT
SUT CHROMIC 2 0 SH (SUTURE) ×3 IMPLANT
SUT CHROMIC 3 0 SH 27 (SUTURE) IMPLANT
SUT VIC AB 2-0 SH 27 (SUTURE)
SUT VIC AB 2-0 SH 27XBRD (SUTURE) IMPLANT
SUT VIC AB 2-0 UR6 27 (SUTURE) IMPLANT
SUT VICRYL 0 UR6 27IN ABS (SUTURE) ×12 IMPLANT
SUT VICRYL AB 2 0 TIE (SUTURE) IMPLANT
SUT VICRYL AB 2 0 TIES (SUTURE)
SYR 20ML LL LF (SYRINGE) ×3 IMPLANT
SYR 27GX1/2 1ML LL SAFETY (SYRINGE) ×3 IMPLANT
SYR 3ML LL SCALE MARK (SYRINGE) ×3 IMPLANT
SYR BULB IRRIG 60ML STRL (SYRINGE) ×3 IMPLANT
SYR CONTROL 10ML LL (SYRINGE) IMPLANT
TAPE CLOTH 3X10 TAN LF (GAUZE/BANDAGES/DRESSINGS) ×3 IMPLANT
TOWEL OR 17X26 10 PK STRL BLUE (TOWEL DISPOSABLE) ×3 IMPLANT
TRAY DSU PREP LF (CUSTOM PROCEDURE TRAY) ×3 IMPLANT
TUBE CONNECTING 12'X1/4 (SUCTIONS) ×1
TUBE CONNECTING 12X1/4 (SUCTIONS) ×2 IMPLANT
UNDERPAD 30X36 HEAVY ABSORB (UNDERPADS AND DIAPERS) ×3 IMPLANT
YANKAUER SUCT BULB TIP NO VENT (SUCTIONS) ×3 IMPLANT

## 2019-07-23 NOTE — Anesthesia Procedure Notes (Signed)
Procedure Name: Intubation Date/Time: 07/23/2019 7:39 AM Performed by: Garrel Ridgel, CRNA Pre-anesthesia Checklist: Patient identified, Emergency Drugs available, Suction available and Patient being monitored Patient Re-evaluated:Patient Re-evaluated prior to induction Oxygen Delivery Method: Circle system utilized Preoxygenation: Pre-oxygenation with 100% oxygen Induction Type: IV induction Ventilation: Mask ventilation without difficulty Laryngoscope Size: Mac and 4 Tube type: Oral Tube size: 7.5 mm Number of attempts: 1 Airway Equipment and Method: Stylet and Oral airway Placement Confirmation: ETT inserted through vocal cords under direct vision,  positive ETCO2 and breath sounds checked- equal and bilateral Tube secured with: Tape Dental Injury: Teeth and Oropharynx as per pre-operative assessment

## 2019-07-23 NOTE — Discharge Instructions (Signed)
ANORECTAL SURGERY:  POST OPERATIVE INSTRUCTIONS  ######################################################################  EAT Start with a pureed / full liquid diet After 24 hours, gradually transition to a high fiber diet.    CONTROL PAIN Control pain so you can tolerate bowel movements,  walk, sleep, tolerate sneezing/coughing, and go up/down stairs.   HAVE A BOWEL MOVEMENT DAILY Keep your bowels regular to avoid problems.   Taking a fiber supplement every day to keep bowels soft.   Try a laxative to override constipation. Use an antidairrheal to slow down diarrhea.   Call if not better after 2 tries  WALK Walk an hour a day.  Control your pain to do that.   CALL IF YOU HAVE PROBLEMS/CONCERNS Call if you are still struggling despite following these instructions. Call if you have concerns not answered by these instructions  ######################################################################    1. Take your usually prescribed home medications unless otherwise directed.  2. DIET: Follow a light bland diet & liquids the first 24 hours after arrival home, such as soup, liquids, starches, etc.  Be sure to drink plenty of fluids.  Quickly advance to a usual solid diet within a few days.  Avoid fast food or heavy meals as your are more likely to get nauseated or have irregular bowels.  A low-fat, high-fiber diet for the rest of your life is ideal.  3. PAIN CONTROL: a. Pain is best controlled by a usual combination of three different methods TOGETHER: i. Ice/Heat ii. Over the counter pain medication iii. Prescription pain medication b. Expect swelling and discomfort in the anus/rectal area.  Warm water baths (30-60 minutes up to 6 times a day, especially after bowel meovements) will help. Use ice for the first few days to help decrease swelling and bruising, then switch to heat such as warm towels, sitz baths, warm baths, etc to help relax tight/sore spots and speed recovery.   Some people prefer to use ice alone, heat alone, alternating between ice & heat.  Experiment to what works for you.   c. It is helpful to take an over-the-counter pain medication continuously for the first few weeks.  Choose one of the following that works best for you: i. Naproxen (Aleve, etc)  Two 250m tabs twice a day ii. Ibuprofen (Advil, etc) Three 2072mtabs four times a day (every meal & bedtime) iii. Acetaminophen (Tylenol, etc) 500-65044mour times a day (every meal & bedtime) d. A  prescription for pain medication (such as oxycodone, hydrocodone, etc) should be given to you upon discharge.  Take your pain medication as prescribed.  i. If you are having problems/concerns with the prescription medicine (does not control pain, nausea, vomiting, rash, itching, etc), please call us [phone] see if we need to switch you to a different pain medicine that will work better for you and/or control your side effect better. ii. If you need a refill on your pain medication, please contact your pharmacy.  They will contact our office to request authorization. Prescriptions will not be filled after 5 pm or on week-ends.  If can take up to 48 hours for it to be filled & ready so avoid waiting until you are down to thel ast pill. e. A topical cream (Dibucaine) or a prescription for a cream (such as diltiazem 2% gel) may be given to you.  Many people find relief with topical creams.  Some people find it burns too much.  Experiment.  If it helps, use it.  If it burns, don't using  it.  Use a Sitz Bath 4-8 times a day for relief   [employer] A sitz bath is a warm water bath taken in the sitting position that covers only the hips and buttocks. It may be used for either healing or hygiene purposes. Sitz baths are also used to relieve pain, itching, or muscle spasms. The water may contain medicine. Moist heat will help you heal and relax.  HOME CARE INSTRUCTIONS  Take 3 to 4 sitz baths a day. 1. Fill the  bathtub half full with warm water. 2. Sit in the water and open the drain a little. 3. Turn on the warm water to keep the tub half full. Keep the water running constantly. 4. Soak in the water for 15 to 20 minutes. 5. After the sitz bath, pat the affected area dry first.   4. KEEP YOUR BOWELS REGULAR a. The goal is one soft bowel movement a day b. Avoid getting constipated.  Between the surgery and the pain medications, it is common to experience some constipation.  Increasing fluid intake and taking a fiber supplement (such as Metamucil, Citrucel, FiberCon, MiraLax, etc) 2-3 times a day regularly will usually help prevent this problem from occurring.  A mild laxative (prune juice, Milk of Magnesia, MiraLax, etc) should be taken according to package directions if there are no bowel movements after 48 hours. c. Watch out for diarrhea.  If you have many loose bowel movements, simplify your diet to bland foods & liquids for a few days.  Stop any stool softeners and decrease your fiber supplement.  Switching to mild anti-diarrheal medications (Kayopectate, Pepto Bismol) can help.  Can try an imodium/loperamide dose.  If this worsens or does not improve, please call us.  5. Wound Care  a. Remove your bandages with your first bowel movement, usually the day after surgery.  You may have packing if you had an abscess.  Let any packing or gauze fall come out.   b. Wear an absorbent pad or soft cotton balls in your underwear as needed to catch any drainage and help keep the area  c. Keep the area clean and dry.  Bathe / shower every day.  Keep the area clean by showering / bathing over the incision / wound.   It is okay to soak an open wound to help wash it.  Consider using a squeeze bottle filled with warm water to gently wash the anal area.  Wet wipes or showers / gentle washing after bowel movements is often less traumatic than regular toilet paper. d. Dennis Bast will often notice bleeding with bowel movements.   This should slow down by the end of the first week of surgery.  Sitting on an ice pack can help. e. Expect some drainage.  This should slow down by the end of the first week of surgery, but you will have occasional bleeding or drainage up to a few months after surgery.  Wear an absorbent pad or soft cotton gauze in your underwear until the drainage stops.  6. ACTIVITIES as tolerated:   a. You may resume regular (light) daily activities beginning the next day--such as daily self-care, walking, climbing stairs--gradually increasing activities as tolerated.  If you can walk 30 minutes without difficulty, it is safe to try more intense activity such as jogging, treadmill, bicycling, low-impact aerobics, swimming, etc. b. Save the most intensive and strenuous activity for last such as sit-ups, heavy lifting, contact sports, etc  Refrain from any heavy lifting or straining  until you are off narcotics for pain control.   c. DO NOT PUSH THROUGH PAIN.  Let pain be your guide: If it hurts to do something, don't do it.  Pain is your body warning you to avoid that activity for another week until the pain goes down. d. You may drive when you are no longer taking prescription pain medication, you can comfortably sit for long periods of time, and you can safely maneuver your car and apply brakes. e. Bonita Quin may have sexual intercourse when it is comfortable.  7. FOLLOW UP in our office a. Please call [facility] at [phone] to set up an appointment to see your surgeon in the office for a follow-up appointment approximately 2-3 weeks after your surgery. b. Make sure that you call for this appointment the day you arrive home to ensure a convenient appointment time.  8. IF YOU HAVE DISABILITY OR FAMILY LEAVE FORMS, BRING THEM TO THE OFFICE FOR PROCESSING.  DO NOT GIVE THEM TO YOUR DOCTOR.        WHEN TO CALL us [phone]: 1. Poor pain control 2. Reactions / problems with new medications (rash/itching, nausea,  etc)  3. Fever over 101.5 F (38.5 C) 4. Inability to urinate 5. Nausea and/or vomiting 6. Worsening swelling or bruising 7. Continued bleeding from incision. 8. Increased pain, redness, or drainage from the incision  The clinic staff is available to answer your questions during regular business hours (8:30am-5pm).  Please don't hesitate to call and ask to speak to one of our nurses for clinical concerns.   A surgeon from [facility] is always on call at the hospitals   If you have a medical emergency, go to the nearest emergency room or call 911.   Post Anesthesia Home Care Instructions  Activity: Get plenty of rest for the remainder of the day. A responsible individual must stay with you for 24 hours following the procedure.  For the next 24 hours, DO NOT: -Drive a car -Advertising copywriter -Drink alcoholic beverages -Take any medication unless instructed by your physician -Make any legal decisions or sign important papers.  Meals: Start with liquid foods such as gelatin or soup. Progress to regular foods as tolerated. Avoid greasy, spicy, heavy foods. If nausea and/or vomiting occur, drink only clear liquids until the nausea and/or vomiting subsides. Call your physician if vomiting continues.  Special Instructions/Symptoms: Your throat may feel dry or sore from the anesthesia or the breathing tube placed in your throat during surgery. If this causes discomfort, gargle with warm salt water. The discomfort should disappear within 24 hours.      [facility] [address], [address], [place], [place]  [address] ? MAIN: [phone] ? TOLL FREE: [phone] ? FAX [phone] [email or web address]   Information for Discharge Teaching: EXPAREL (bupivacaine liposome injectable suspension)   Your surgeon gave you EXPAREL(bupivacaine) in your surgical incision to help control your pain after surgery.   EXPAREL is a local anesthetic  that provides pain relief by numbing the tissue around the surgical site.  EXPAREL is designed to release pain medication over time and can control pain for up to 72 hours.  Depending on how you respond to EXPAREL, you may require less pain medication during your recovery.  Possible side effects:  Temporary loss of sensation or ability to move in the area where bupivacaine was injected.  Nausea, vomiting, constipation  Rarely, numbness and tingling in your mouth or lips, lightheadedness, or  anxiety may occur.  Call your doctor right away if you think you may be experiencing any of these sensations, or if you have other questions regarding possible side effects.  Follow all other discharge instructions given to you by your surgeon or nurse. Eat a healthy diet and drink plenty of water or other fluids.  If you return to the hospital for any reason within 96 hours following the administration of EXPAREL, please inform your health care providers.Information for Discharge Teaching: EXPAREL (bupivacaine liposome injectable suspension)   Your surgeon gave you EXPAREL(bupivacaine) in your surgical incision to help control your pain after surgery.   EXPAREL is a local anesthetic that provides pain relief by numbing the tissue around the surgical site.  EXPAREL is designed to release pain medication over time and can control pain for up to 72 hours.  Depending on how you respond to EXPAREL, you may require less pain medication during your recovery.  Possible side effects:  Temporary loss of sensation or ability to move in the area where bupivacaine was injected.  Nausea, vomiting, constipation  Rarely, numbness and tingling in your mouth or lips, lightheadedness, or anxiety may occur.  Call your doctor right away if you think you may be experiencing any of these sensations, or if you have other questions regarding possible side effects.  Follow all other discharge instructions given to  you by your surgeon or nurse. Eat a healthy diet and drink plenty of water or other fluids.  If you return to the hospital for any reason within 96 hours following the administration of EXPAREL, please inform your health care providers.

## 2019-07-23 NOTE — Anesthesia Postprocedure Evaluation (Signed)
Anesthesia Post Note  Patient: Cordale Manera  Procedure(s) Performed: EXAM UNDER ANESTHESIA (N/A Rectum) REPAIR OF PERIRECTAL FISTULA (N/A Rectum)     Patient location during evaluation: PACU Anesthesia Type: General Level of consciousness: sedated Pain management: pain level controlled Vital Signs Assessment: post-procedure vital signs reviewed and stable Respiratory status: spontaneous breathing and respiratory function stable Cardiovascular status: stable Postop Assessment: no apparent nausea or vomiting Anesthetic complications: no   No complications documented.  Last Vitals:  Vitals:   07/23/19 0947 07/23/19 1000  BP:  (!) 168/85  Pulse: 71   Resp:    Temp:    SpO2: 100%     Last Pain:  Vitals:   07/23/19 1030  TempSrc:   PainSc: 5                  Weldon Nouri DANIEL

## 2019-07-23 NOTE — H&P (Signed)
Brian Gilbert Documented: 05/05/2019 10:12 AM  DOB: [birth year]    ` ` Patient sent for surgical consultation at the request of Dr Gray Bernhardt @ [facility] ED  Chief Complaint: Perianal drainage and pain. Concern for fistula ` ` The patient is a gentle history of anal pain and found to have an abscess. Incision and drainage in the emergency room 08/21/2018. Placed on Bactrim. He developed some blistering and a rash especially his penis. Switched off of that. Follow-up with urology. Blisters and rash healed up on his genitalia and elsewhere.   However, he is had intermittent anal pain and drainage since. Went to the emergency room most recently April 22, 2019. Dr. Effie Shy suspected patient had an anal fistula. Placed on antibiotics. Surgical consultation recommended. Patient usually moves his bowels on a daily basis. Can walk several miles without difficulty. He does smoke tobacco. No diabetes. He's had some issues with alcohol binge use that has tapered off. Had some loss of consciousness and seizures perhaps related to that. Not an issue now. No abdominal surgery. No personal nor family history of GI/colon cancer, inflammatory bowel disease, irritable bowel syndrome, allergy such as Celiac Sprue, dietary/dairy problems, colitis, ulcers nor gastritis. No recent sick contacts/gastroenteritis. No travel outside the country. No changes in diet. No dysphagia to solids or liquids. No significant heartburn or reflux. No hematochezia, hematemesis, coffee ground emesis. No evidence of prior gastric/peptic ulceration.   (Review of systems as stated in this history (HPI) or in the review of systems. Otherwise all other 12 point ROS are negative) ` ` `  This patient encounter took 25 minutes today to perform the following: obtain history, perform exam, review outside records, interpret tests & imaging, counsel the patient on their diagnosis; and, document this encounter,  including findings & plan in the electronic health record (EHR).   Diagnostic Studies History [name], CMA; 05/05/2019 10:12 AM) Colonoscopy never  Allergies ([name], CMA; 05/05/2019 10:13 AM) No Known Drug Allergies [05/05/2019]: Allergies Reconciled  Medication History ([name], CMA; 05/05/2019 10:13 AM) Omeprazole (20MG  Tablet DR, Oral) Active. Medications Reconciled  Social History , MD; 05/05/2019 10:49 AM) Alcohol use Remotely quit alcohol use. Caffeine use Carbonated beverages, Coffee, Tea. Illicit drug use Remotely quit drug use. Tobacco use Current every day smoker. He is from 05/07/[place], originally [place]. He is from [place].  Other Problems [place], CMA; 05/05/2019 10:12 AM) Anxiety Disorder High blood pressure Seizure Disorder     Review of Systems ([name] CMA; 05/05/2019 10:12 AM) General Present- Fatigue. Not Present- Appetite Loss, Chills, Fever, Night Sweats, Weight Gain and Weight Loss. Skin Present- Rash. Not Present- Change in Wart/Mole, Dryness, Hives, Jaundice, New Lesions, Non-Healing Wounds and Ulcer. HEENT Present- Seasonal Allergies. Not Present- Earache, Hearing Loss, Hoarseness, Nose Bleed, Oral Ulcers, Ringing in the Ears, Sinus Pain, Sore Throat, Visual Disturbances, Wears glasses/contact lenses and Yellow Eyes. Respiratory Not Present- Bloody sputum, Chronic Cough, Difficulty Breathing, Snoring and Wheezing. Breast Not Present- Breast Mass, Breast Pain, Nipple Discharge and Skin Changes. Cardiovascular Not Present- Chest Pain, Difficulty Breathing Lying Down, Leg Cramps, Palpitations, Rapid Heart Rate, Shortness of Breath and Swelling of Extremities. Gastrointestinal Not Present- Abdominal Pain, Bloating, Bloody Stool, Change in Bowel Habits, Chronic diarrhea, Constipation, Difficulty Swallowing, Excessive gas, Gets full quickly at meals, Hemorrhoids,  Indigestion, Nausea, Rectal Pain and Vomiting. Male Genitourinary Not Present- Blood in Urine, Change in Urinary Stream, Frequency, Impotence, Nocturia, Painful Urination, Urgency and Urine Leakage. Musculoskeletal Present- Back Pain  and Joint Stiffness. Not Present- Joint Pain, Muscle Pain, Muscle Weakness and Swelling of Extremities. Neurological Not Present- Decreased Memory, Fainting, Headaches, Numbness, Seizures, Tingling, Tremor, Trouble walking and Weakness. Psychiatric Present- Change in Sleep Pattern. Not Present- Anxiety, Bipolar, Depression, Fearful and Frequent crying. Endocrine Not Present- Cold Intolerance, Excessive Hunger, Hair Changes, Heat Intolerance, Hot flashes and New Diabetes. Hematology Not Present- Blood Thinners, Easy Bruising, Excessive bleeding, Gland problems, HIV and Persistent Infections.  Vitals ([name] CMA; 05/05/2019 10:14 AM) 05/05/2019 10:13 AM Weight: 240 lb Height: 72in Body Surface Area: 2.3 m Body Mass Index: 32.55 kg/m  Temp.: 97.38F(Oral)  Pulse: 78 (Regular)  BP: 134/84 (Sitting, Left Arm, Standard)        Physical Exam [name] MD; 05/05/2019 10:50 AM)  General Mental Status-Alert. General Appearance-Not in acute distress, Not Sickly. Orientation-Oriented X3. Hydration-Well hydrated. Voice-Normal.  Integumentary Global Assessment Upon inspection and palpation of skin surfaces of the - Axillae: non-tender, no inflammation or ulceration, no drainage. and Distribution of scalp and body hair is normal. General Characteristics Temperature - normal warmth is noted.  Head and Neck Head-normocephalic, atraumatic with no lesions or palpable masses. Face Global Assessment - atraumatic, no absence of expression. Neck Global Assessment - no abnormal movements, no bruit auscultated on the right, no bruit auscultated on the left, no decreased range of motion,  non-tender. Trachea-midline. Thyroid Gland Characteristics - non-tender.  Eye Eyeball - Left-Extraocular movements intact, No Nystagmus - Left. Eyeball - Right-Extraocular movements intact, No Nystagmus - Right. Cornea - Left-No Hazy - Left. Cornea - Right-No Hazy - Right. Sclera/Conjunctiva - Left-No scleral icterus, No Discharge - Left. Sclera/Conjunctiva - Right-No scleral icterus, No Discharge - Right. Pupil - Left-Direct reaction to light normal. Pupil - Right-Direct reaction to light normal.  ENMT Ears Pinna - Left - no drainage observed, no generalized tenderness observed. Pinna - Right - no drainage observed, no generalized tenderness observed. Nose and Sinuses External Inspection of the Nose - no destructive lesion observed. Inspection of the nares - Left - quiet respiration. Inspection of the nares - Right - quiet respiration. Mouth and Throat Lips - Upper Lip - no fissures observed, no pallor noted. Lower Lip - no fissures observed, no pallor noted. Nasopharynx - no discharge present. Oral Cavity/Oropharynx - Tongue - no dryness observed. Oral Mucosa - no cyanosis observed. Hypopharynx - no evidence of airway distress observed.  Chest and Lung Exam Inspection Movements - Normal and Symmetrical. Accessory muscles - No use of accessory muscles in breathing. Palpation Palpation of the chest reveals - Non-tender. Auscultation Breath sounds - Normal and Clear.  Cardiovascular Auscultation Rhythm - Regular. Murmurs & Other Heart Sounds - Auscultation of the heart reveals - No Murmurs and No Systolic Clicks.  Abdomen Inspection Inspection of the abdomen reveals - No Visible peristalsis and No Abnormal pulsations. Umbilicus - No Bleeding, No Urine drainage. Palpation/Percussion Palpation and Percussion of the abdomen reveal - Soft, Non Tender, No Rebound tenderness, No Rigidity (guarding) and No Cutaneous hyperesthesia. Note: Abdomen soft.  Nontender. Not distended. No umbilical or incisional hernias. No guarding.  Male Genitourinary Sexual Maturity Tanner 5 - Adult hair pattern and Adult penile size and shape. Note: No inguinal hernias. Normal external genitalia. No sores blisters or lesions. No condyloma. Epididymi, testes, and spermatic cords normal without any masses.  Rectal Note: Left anterior sinus with some firmness consistent with a perirectal fistula. I can feel a cord heading towards the sphincter. No undrained abscess. Mild posterior anal tags. Mildly  increased sphincter tone but no obvious fissure. No other hemorrhoids. I held off on any more aggressive internal exam  Peripheral Vascular Upper Extremity Inspection - Left - No Cyanotic nailbeds - Left, Not Ischemic. Inspection - Right - No Cyanotic nailbeds - Right, Not Ischemic.  Neurologic Neurologic evaluation reveals -normal attention span and ability to concentrate, able to name objects and repeat phrases. Appropriate fund of knowledge , normal sensation and normal coordination. Mental Status Affect - not angry, not paranoid. Cranial Nerves-Normal Bilaterally. Gait-Normal.  Neuropsychiatric Mental status exam performed with findings of-able to articulate well with normal speech/language, rate, volume and coherence, thought content normal with ability to perform basic computations and apply abstract reasoning and no evidence of hallucinations, delusions, obsessions or homicidal/suicidal ideation.  Musculoskeletal Global Assessment Spine, Ribs and Pelvis - no instability, subluxation or laxity. Right Upper Extremity - no instability, subluxation or laxity.  Lymphatic Head & Neck  General Head & Neck Lymphatics: Bilateral - Description - No Localized lymphadenopathy. Axillary  General Axillary Region: Bilateral - Description - No Localized lymphadenopathy. Femoral & Inguinal  Generalized Femoral & Inguinal Lymphatics: Left -  Description - No Localized lymphadenopathy. Right - Description - No Localized lymphadenopathy.    Assessment & Plan Brian Hector MD; 05/05/2019 12:38 PM)  ANAL FISTULA (K60.3) Impression: History of anal abscess with persistent intermittent drainage. Story and exam classic for anal fistula. I think he would benefit from examination anesthesia. Probable LIFT fistula repair versus seton & staged repair. We will see.  I strongly encouraged him to quit smoking as that raises the likelihood of recurrent infections, surgical failure, and other problems.  Current Plans Pt Education - CCS Abscess/Fistula (AT): discussed with patient and provided information. The anatomy & physiology of the anorectal region was discussed. We discussed the pathophysiology of anorectal abscess and fistula. Differential diagnosis was discussed. Natural history progression was discussed. I stressed the importance of a bowel regimen to have daily soft bowel movements to minimize progression of disease.  The patient's condition is not adequately controlled. Non-operative treatment has not healed the fistula. Therefore, I recommended examination under anaesthesia to confirm the diagnosis and treat the fistula. I discussed techniques that may be required such as fistulotomy, ligation by LIFT technique, and/or seton placement. Benefits & alternatives discussed. I noted a good likelihood this will help address the problem, but sometimes repeat operations and prolonged healing times may occur. Risks such as bleeding, pain, recurrence, reoperation, incontinence, heart attack, death, and other risks were discussed.  Educational handouts further explaining the pathology, treatment options, and bowel regimen were given. The patient expressed understanding & wishes to proceed. We will work to coordinate surgery for a mutually convenient time.   ENCOUNTER FOR PREOPERATIVE EXAMINATION FOR GENERAL SURGICAL  PROCEDURE (Z01.818)  Current Plans You are being scheduled for surgery- Our schedulers will call you.  You should hear from our office's scheduling department within 5 working days about the location, date, and time of surgery. We try to make accommodations for patient's preferences in scheduling surgery, but sometimes the OR schedule or the surgeon's schedule prevents [place] from making those accommodations.  If you have not heard from our office [phone]) in 5 working days, call the office and ask for your surgeon's nurse.  If you have other questions about your diagnosis, plan, or surgery, call the office and ask for your surgeon's nurse.  Pt Education - CCS Rectal Prep for Anorectal outpatient/office surgery: discussed with patient and provided information. Pt Education - CCS  Rectal Surgery HCI (Atiana Levier): discussed with patient and provided information. Pt Education - CCS Good Bowel Health (Kingson Lohmeyer)  TOBACCO ABUSE (Z72.0) Impression: STOP SMOKING!  We talked to the patient about the dangers of smoking. We stressed that tobacco use dramatically increases the risk of peri-operative complications such as infection, tissue necrosis leaving to problems with incision/wound and organ healing, hernia, chronic pain, heart attack, stroke, DVT, pulmonary embolism, and death. We noted there are programs in our community to help stop smoking. Information was available.  Current Plans Pt Education - CCS STOP SMOKING!  [name], MD, FACS, MASCRS Gastrointestinal and Minimally Invasive Surgery  [facility] [address] [place], [place] [address] [phone] Main / Paging [phone] Fax
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
## 2019-07-23 NOTE — Op Note (Signed)
07/23/2019  9:02 AM  PATIENT:  Brian Gilbert  44 y.o. male  Patient Care Team: Jefm Petty, MD as PCP - General (Family Medicine) Michael Boston, MD as Consulting Physician (General Surgery) Franchot Gallo, MD as Consulting Physician (Urology)  PRE-OPERATIVE DIAGNOSIS:  PERIRECTAL FISTULA  POST-OPERATIVE DIAGNOSIS:  INTERSPHINCTERIC PERIRECTAL FISTULA  PROCEDURE:   LIFT (ligation of intersphincteric tract) REPAIR OF PERIRECTAL FISTULA HEMORRHOIDAL LIGATION/PEXY ANORECTAL EXAM UNDER ANESTHESIA  SURGEON:  Adin Hector, MD  ASSISTANT: OR Staff   ANESTHESIA:   General Anorectal & Local field block (0.25% bupivacaine with epinephrine mixed with Liposomal bupivacaine (Experel)    EBL:  Total I/O In: -  Out: 20 [Blood:20].  See anesthesia record  Delay start of Pharmacological VTE agent (>24hrs) due to surgical blood loss or risk of bleeding:  no  DRAINS: none   SPECIMEN: External component of intersphincteric fistula tract  DISPOSITION OF SPECIMEN:  PATHOLOGY  COUNTS:  YES  PLAN OF CARE: Discharge to home after PACU  PATIENT DISPOSITION:  PACU - hemodynamically stable.  INDICATION: Patient with probable perirectal fistula.  I recommended examination and surgical treatment:  The anatomy & physiology of the anorectal region was discussed.  We discussed the pathophysiology of anorectal abscess and fistula.  Differential diagnosis was discussed.  Natural history progression was discussed.   I stressed the importance of a bowel regimen to have daily soft bowel movements to minimize progression of disease.     The patient's condition is not adequately controlled.  Non-operative treatment has not healed the fistula.  Therefore, I recommended examination under anaesthesia to confirm the diagnosis and treat the fistula.  I discussed techniques that may be required such as fistulotomy, ligation by LIFT technique, and/or seton placement.  Benefits & alternatives  discussed.  I noted a good likelihood this will help address the problem, but sometimes repeat operations and prolonged healing times may occur.  Risks such as bleeding, pain, recurrence, reoperation, incontinence, heart attack, death, and other risks were discussed.      Educational handouts further explaining the pathology, treatment options, and bowel regimen were given.  The patient expressed understanding & wishes to proceed.  We will work to coordinate surgery for a mutually convenient time.   OR FINDINGS: Patient had an intersphincteric fistula.    External location LEFT LATERAL   about 3 cm from anal verge.  Internal location : Anterior midline at anal crypt about 1 cm from anal verge.  Grade 2 internal hemorrhoids.  Left internal hemorrhoid ligation/pexy done   DESCRIPTION:   Informed consent was confirmed. Patient underwent general anesthesia without difficulty. Patient was placed into prone positioning.  The perianal region was prepped and draped in sterile fashion. Surgical timeout confirmed or plan.  I did digital rectal examination and then transitioned over to anoscopy to get a sense of the anatomy.  I did place a probe through the external opening but it would not pass more proximally from the chronic abscess cavity.  I also injected the track with methylene blue.  With this I was able to locate an internal opening near the anterior midline and an anal crypt.  Slightly left anterior.  The tract did not feel superficial, concerning for a probable intersphincteric fistula.  I was able to use a shepherd's hook probe from the anal crypt and easily pass through the sphincter complex out into the left lateral ischio rectal fossa.  No abscess located.  I went ahead and proceeded with the LIFT technique.  I  began to excise the external opening with a radial biconcave incision around it.  I transitioned to cautery and help free the fistulous tract circumferentially all way down towards the  sphincter component.  I was able to come through the fistulous tract a little more proximally to more clean opening stained with methylene blue.  With the track straighten out I was able to get a probe from the external and and connect with the internal anal crypt opening.  I made a transverse incision at the anal squamocolumnar junction .  Did careful dissection to get down to the sphincter complex.  I carefully went between the internal and external sphincter using careful blunt dissection parallel to the fibers.  I was able to locate the intersphincteric component of the fistulous tract.  I was able to get around it gently with a right angle clamp.  I carefully skeletonized the intersphincteric component. I placed 2-0 Vicryl stitches through the intersphincteric tract on the proximal side and on the distal side in between the external & internal sphincters.  I transected the intersphincteric segment of the fistulous tract.   I ligated the stumps of the transected segments with 2-0 Vicryl again with a figure-of-eight stitch in a 90 degree fashion to doubly ligate and prove that the tract had been closed.   I removed the superficial external end of the fistulous tract & ligated the base of the external wound just external to the sphincter component with 2-0 vicryl.  I then transitioned to the rectal component.  Did a figure-of-eight stitch of 2-0 Vicryl suture 6 centimeters proximal to the internal opening in the rectum along a hemorrhoidal:.  I ran that longitudinally until it came to the anal verge.  Tied down with a running suture.  That provided good ligation and pexy and also cover up to the internal opening of the anal verge.  I closed the transverse arc anal verge wound where I done internal sphincteric tract ligation.  I used 2-0 chromic interrupted horizontal mattress sutures to help close that transversely.  Hemostasis was excellent.  I reexamined the anal canal.   There is was no narrowing.   Hemostasis was excellent.  I repeated anoscopy and examination.  Hemostasis was good.  We placed fluff gauze to onlay over the wounds.  No packing done.  Patient was extubated.  He woke up somewhat agitated and confused but was ultimately consolable and is sitting up alert and apologetic in the recovery room.  Breathing well.  I called and discussed operative findings with the patient's father, Heron Nay, per his request.  Questions answered.  He expressed understanding and appreciation.  Initial prescription to [facility] apparently was rejected so switched to [facility] pharmacy   [name], M.D., F.A.C.S. Gastrointestinal and Minimally Invasive Surgery [facility] [address] [place], [place] [address] [phone] Main / Paging

## 2019-07-23 NOTE — Transfer of Care (Signed)
Immediate Anesthesia Transfer of Care Note  Patient: Brian Gilbert  Procedure(s) Performed: Francia Greaves UNDER ANESTHESIA (N/A Rectum) REPAIR OF PERIRECTAL FISTULA (N/A Rectum)  Patient Location: PACU  Anesthesia Type:General  Level of Consciousness: awake, alert , oriented and patient cooperative  Airway & Oxygen Therapy: Patient Spontanous Breathing and Patient connected to face mask oxygen  Post-op Assessment: Report given to RN and Post -op Vital signs reviewed and stable  Post vital signs: Reviewed and stable  Last Vitals:  Vitals Value Taken Time  BP 159/86 07/23/19 0908  Temp 36.5 C 07/23/19 0909  Pulse 77 07/23/19 0911  Resp 18 07/23/19 0911  SpO2 100 % 07/23/19 0911  Vitals shown include unvalidated device data.  Last Pain:  Vitals:   07/23/19 0603  TempSrc: Oral         Complications: No complications documented.

## 2019-07-24 ENCOUNTER — Encounter (HOSPITAL_BASED_OUTPATIENT_CLINIC_OR_DEPARTMENT_OTHER): Payer: Self-pay | Admitting: Surgery

## 2019-07-24 LAB — SURGICAL PATHOLOGY

## 2019-12-08 ENCOUNTER — Ambulatory Visit: Payer: Self-pay | Admitting: Surgery

## 2019-12-08 NOTE — H&P (Signed)
Brian Gilbert Appointment: 12/08/2019 11:45 AM Location: [facility] Patient #: [id number] DOB: November 27, 1975 Single / Language: [place] / Race: Asian Male  History of Present Illness [name] MD; 12/08/2019 1:16 PM) The patient is a 44 year old male who presents with anal fistula. Note for "Anal fistula": ` ` ` The patient returns s/p LIFT repair of intersphincteric perirectal fistula 07/23/2019  Pathology: Fistula tract. No evidence of malignancy.    Patient with intersphincteric fistula status post lift repair in June. Seemed to recover and was healed by month out. Follow-up when necessary in July. Patient notes he had recurrent pain and swelling and drainage this month. Wished to be reevaluated. Notes drainage has calm down and he is feeling better. Thinks it was triggered after stopping MiraLAX and having some hard stools. He's back on the MiraLAX and it is softer. He has noted some intermittent drainage. Drainage has not gone away. He continues to smoke. No history of fall or trauma. No history of hemorrhoid issues.  No personal nor family history of GI/colon cancer, inflammatory bowel disease, irritable bowel syndrome, allergy such as Celiac Sprue, dietary/dairy problems, colitis, ulcers nor gastritis. No recent sick contacts/gastroenteritis. No travel outside the country. No changes in diet. No dysphagia to solids or liquids. No significant heartburn or reflux. No melena, hematemesis, coffee ground emesis. No evidence of prior gastric/peptic ulceration.   Marland Kitchen    ##############################################    SURGICAL PATHOLOGY CASE: [id number] PATIENT: [facility] Surgical Pathology Report     Clinical History: Perirectal fistula (crm)     FINAL MICROSCOPIC DIAGNOSIS:  A. FISTULA TRACT: - Consistent with fistula tract - No evidence of malignancy    Brian Gilbert DESCRIPTION:  Received in formalin are 3 irregular  pieces of yellow-pink to red soft indurated tissue, 1.1 x 0.8 x 0.5 cm, 1.6 x 1.4 x 1.2 cm, and 2.7 x 2.2 x 1.8 cm. All 3 pieces have tan-pink to lightly blue stained wrinkled to granular skin on one aspect. The portions of skin on the 2 larger pieces are umbilicated, and within the umbilicated areas have defects of 0.2 cm in diameter which opened into fistula-like tracts which are lined by pink-red to blue stained soft tissue and exit on the deep surfaces. Representative sections are submitted in 1 block.  SW 07/23/2019    Final Diagnosis performed by [name], MD. Electronically signed 07/24/2019 Technical and / or Professional components performed at [facility], [address], [place], [place] [address]. Immunohistochemistry Technical component (if applicable) was performed at [facility] ([facility] [address], [place], [place] [address]. IMMUNOHISTOCHEMISTRY DISCLAIMER (if applicable): Some of these immunohistochemical stains may have been developed and the performance characteristics determine by [facility]. Some may not have been cleared or approved by the U.S. Food and Drug Administration. The FDA has determined that such clearance or approval is not necessary. This test is used for clinical purposes. It should not be regarded as investigational or for research. This laboratory is certified under the Clinical Laboratory Improvement Amendments of 1988 (CLIA-88) as qualified to perform high complexity clinical laboratory testing. The controls stained appropriately. ` ` `  07/23/2019  9:02 AM  PATIENT: Brian Gilbert 44 y.o. male  Patient Care Team: Loyal Jacobson, MD as PCP - General (Family Medicine) Karie Soda, MD as Consulting Physician (General Surgery) Marcine Matar, MD as Consulting Physician (Urology)  PRE-OPERATIVE DIAGNOSIS: PERIRECTAL FISTULA  POST-OPERATIVE DIAGNOSIS:  INTERSPHINCTERIC PERIRECTAL FISTULA  PROCEDURE:  LIFT (  ligation of intersphincteric tract) REPAIR OF PERIRECTAL FISTULA HEMORRHOIDAL LIGATION/PEXY ANORECTAL EXAM UNDER ANESTHESIA  SURGEON: [name], MD  ASSISTANT: OR Staff  ANESTHESIA:  General Anorectal & Local field block (0.25% bupivacaine with epinephrine mixed with Liposomal bupivacaine (Experel)   EBL: Total I/O In: - Out: 20 [Blood:20]. See anesthesia record  Delay start of Pharmacological VTE agent (>24hrs) due to surgical blood loss or risk of bleeding: no  DRAINS: none  SPECIMEN: External component of intersphincteric fistula tract  DISPOSITION OF SPECIMEN: PATHOLOGY  COUNTS: YES  PLAN OF CARE: Discharge to home after PACU  PATIENT DISPOSITION: PACU - hemodynamically stable.  INDICATION: Patient with probable perirectal fistula. I recommended examination and surgical treatment:  The anatomy & physiology of the anorectal region was discussed. We discussed the pathophysiology of anorectal abscess and fistula. Differential diagnosis was discussed. Natural history progression was discussed. I stressed the importance of a bowel regimen to have daily soft bowel movements to minimize progression of disease.   The patient's condition is not adequately controlled. Non-operative treatment has not healed the fistula. Therefore, I recommended examination under anaesthesia to confirm the diagnosis and treat the fistula. I discussed techniques that may be required such as fistulotomy, ligation by LIFT technique, and/or seton placement. Benefits & alternatives discussed. I noted a good likelihood this will help address the problem, but sometimes repeat operations and prolonged healing times may occur. Risks such as bleeding, pain, recurrence, reoperation, incontinence, heart attack, death, and other risks were discussed.   Educational handouts further explaining the pathology, treatment options,  and bowel regimen were given. The patient expressed understanding & wishes to proceed. We will work to coordinate surgery for a mutually convenient time.  OR FINDINGS: Patient had an intersphincteric fistula.   External location LEFT LATERAL  about 3 cm from anal verge.  Internal location : Anterior midline at anal crypt about 1 cm from anal verge.  Grade 2 internal hemorrhoids. Left internal hemorrhoid ligation/pexy done  DESCRIPTION:  Informed consent was confirmed. Patient underwent general anesthesia without difficulty. Patient was placed into prone positioning. The perianal region was prepped and draped in sterile fashion. Surgical timeout confirmed or plan.  I did digital rectal examination and then transitioned over to anoscopy to get a sense of the anatomy. I did place a probe through the external opening but it would not pass more proximally from the chronic abscess cavity. I also injected the track with methylene blue. With this I was able to locate an internal opening near the anterior midline and an anal crypt. Slightly left anterior. The tract did not feel superficial, concerning for a probable intersphincteric fistula. I was able to use a shepherd's hook probe from the anal crypt and easily pass through the sphincter complex out into the left lateral ischio rectal fossa. No abscess located.  I went ahead and proceeded with the LIFT technique. I began to excise the external opening with a radial biconcave incision around it. I transitioned to cautery and help free the fistulous tract circumferentially all way down towards the sphincter component. I was able to come through the fistulous tract a little more proximally to more clean opening stained with methylene blue. With the track straighten out I was able to get a probe from the external and and connect with the internal anal crypt opening.  I made a transverse incision at the anal squamocolumnar junction . Did  careful dissection to get down to the sphincter complex. I carefully went between the internal  and external sphincter using careful blunt dissection parallel to the fibers. I was able to locate the intersphincteric component of the fistulous tract. I was able to get around it gently with a right angle clamp. I carefully skeletonized the intersphincteric component. I placed 2-0 Vicryl stitches through the intersphincteric tract on the proximal side and on the distal side in between the external & internal sphincters. I transected the intersphincteric segment of the fistulous tract. I ligated the stumps of the transected segments with 2-0 Vicryl again with a figure-of-eight stitch in a 90 degree fashion to doubly ligate and prove that the tract had been closed. I removed the superficial external end of the fistulous tract & ligated the base of the external wound just external to the sphincter component with 2-0 vicryl.  I then transitioned to the rectal component. Did a figure-of-eight stitch of 2-0 Vicryl suture 6 centimeters proximal to the internal opening in the rectum along a hemorrhoidal:. I ran that longitudinally until it came to the anal verge. Tied down with a running suture. That provided good ligation and pexy and also cover up to the internal opening of the anal verge. I closed the transverse arc anal verge wound where I done internal sphincteric tract ligation. I used 2-0 chromic interrupted horizontal mattress sutures to help close that transversely. Hemostasis was excellent.  I reexamined the anal canal. There is was no narrowing. Hemostasis was excellent. I repeated anoscopy and examination. Hemostasis was good. We placed fluff gauze to onlay over the wounds. No packing done. Patient was extubated. He woke up somewhat agitated and confused but was ultimately consolable and is sitting up alert and apologetic in the recovery room. Breathing well. I called and discussed  operative findings with the patient's father, Heron Nay, per his request. Questions answered. He expressed understanding and appreciation. Initial prescription to [facility] apparently was rejected so switched to [facility] pharmacy   [name], M.D., F.A.C.S. Gastrointestinal and Minimally Invasive Surgery [facility] 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) repair of perirectal fistula - 07/23/2019 - Kadan Millstein ENCOUNTER FOR PREOPERATIVE EXAMINATION FOR GENERAL SURGICAL PROCEDURE (Z01.818) TOBACCO ABUSE (Z72.0) F/U PRN - HISTORY OF RECTAL SURGERY (X54.008)  Diagnostic Studies History [name], MD; 12/08/2019 11:54 AM) Colonoscopy never  Allergies [name], MD; 12/08/2019 11:54 AM) PCN-200 *MISCELLANEOUS THERAPEUTIC CLASSES* Allergies Reconciled Bactrim *ANTI-INFECTIVE AGENTS - MISC.*  Medication History [name], CMA; 12/08/2019 11:50 AM) Medications Reconciled  Social History [name], MD; 12/08/2019 11:54 AM) Alcohol use Remotely quit alcohol use. Caffeine use Carbonated beverages, Coffee, Tea. Illicit drug use Remotely quit drug use. Tobacco use Current every day smoker. He is from [place]. He [place].  Other Problems [name], MD; 12/08/2019 11:54 AM) Anxiety Disorder High blood pressure Seizure Disorder    Vitals ([name] CMA; 12/08/2019 11:50 AM) 12/08/2019 11:50 AM Weight: 244 lb Height: 72in Body Surface Area: 2.32 m Body Mass Index: 33.09 kg/m  Pulse: 94 (Regular)  BP: 118/78(Sitting, Left Arm, Standard)        Physical Exam [name] MD; 12/08/2019 12:07 PM)  General Mental Status-Alert. General Appearance-Not in acute  distress. Voice-Normal.  Integumentary Global Assessment Upon inspection and palpation of skin surfaces of the - Distribution of scalp and body hair is normal. General Characteristics Overall examination of the  patient's skin reveals - no rashes and no suspicious lesions.  Head and Neck Head-normocephalic, atraumatic with no lesions or palpable masses. Face Global Assessment - atraumatic, no absence of expression. Neck Global Assessment - no abnormal movements, no decreased range of motion. Trachea-midline. Thyroid Gland Characteristics - non-tender.  Eye Eyeball - Left-Extraocular movements intact, No Nystagmus - Left. Eyeball - Right-Extraocular movements intact, No Nystagmus - Right. Upper Eyelid - Left-No Cyanotic - Left. Upper Eyelid - Right-No Cyanotic - Right.  Chest and Lung Exam Inspection Accessory muscles - No use of accessory muscles in breathing.  Abdomen Note: Obese but soft. No umbilical hernia. No pain or discomfort. Mild diastases recti.  Male Genitourinary Note: No inguinal hernias. Normal external genitalia. Epididymi, testes, and spermatic cords normal without any masses.  Rectal Note: Perianal skin was some moisture and maceration consistent with some chronic pruritus. Left anterior pinhole opening and some fold thickening going to left anterior aspect suspicious for recurrent pararectal fistula. Sensitive but tolerates digital and anoscopic exam. Some mild anal canal thickening. I suspect he has a small opening at the anal verge. Anterior midline.  Rather suspicious for superficial perirectal fistula recurrence.  No posterior midline fissure. No obvious proctitis. Grade 1-2 internal hemorrhoids.  Peripheral Vascular Upper Extremity Inspection - Left - Not Gangrenous, No Petechiae. Inspection - Right - Not Gangrenous, No Petechiae.  Neurologic Neurologic evaluation reveals -normal attention span and ability to concentrate, able to  name objects and repeat phrases. Appropriate fund of knowledge and normal coordination.  Neuropsychiatric Mental status exam performed with findings of-able to articulate well with normal speech/language, rate, volume and coherence and no evidence of hallucinations, delusions, obsessions or homicidal/suicidal ideation. Orientation-oriented X3.  Musculoskeletal Global Assessment Gait and Station - normal gait and station.  Lymphatic General Lymphatics Description - No Generalized lymphadenopathy.   Results [name] MD; 12/08/2019 1:16 PM) Procedures  Name Value Date Hemorrhoids Procedure Other: Perianal skin was some moisture and maceration consistent with some chronic pruritus. Left anterior pinhole opening and some fold thickening going to left anterior aspect suspicious for recurrent pararectal fistula. Sensitive but tolerates digital and anoscopic exam. Some mild anal canal thickening. I suspect he has a small opening at the anal verge. Anterior midline............Marland KitchenRather suspicious for superficial perirectal fistula recurrence............Marland KitchenNo posterior midline fissure. No obvious proctitis. Grade 1-2 internal hemorrhoids.  Performed: 12/08/2019 12:08 PM    Assessment & Plan [name] MD; 12/08/2019 12:18 PM)  Ocie Cornfield FISTULA (K60.4) Impression: Status post LIFTrepair of intersphincteric fistula in June - healed July 2021. Now with evidence of pain swelling and drainage a week ago. Examination suspicious for superficial recurrence. Other possibility is intersphincteric.  I think this will require examination under anesthesia. Possible fistulotomy versus internal sphincterotomy/seton placement depending upon what is found intraoperatively. I don't think doing an MRI of the pelvis without much at this point since it seems apparent on physical exam he does not have a history of inflammatory bowel disease or other major GI/anorectal issues.  I  strongly recommend he stay on some type of fiber bowel regimen such as MiraLAX everyday to keep his stools soft to avoid future triggering events.  I again strongly recommend he quit smoking as that affects poor wound healing and raises my concern that his risk of recurrence goes up.  Trying get a copy of his colonoscopy report from May. Had a seven-year follow-up recommended, so I suspect it was just showing 1 polyp. Double check with his gastrologist, Dr. Octaviano Glow.  Current  Plans The anatomy & physiology of the anorectal region was discussed. We discussed the pathophysiology of anorectal abscess and fistula. Differential diagnosis was discussed. Natural history progression was discussed. I stressed the importance of a bowel regimen to have daily soft bowel movements to minimize progression of disease.  The patient's condition is not adequately controlled. Non-operative treatment has not healed the fistula. Therefore, I recommended examination under anaesthesia to confirm the diagnosis and treat the fistula. I discussed techniques that may be required such as fistulotomy, ligation by LIFT technique, and/or seton placement. Benefits & alternatives discussed. I noted a good likelihood this will help address the problem, but sometimes repeat operations and prolonged healing times may occur. Risks such as bleeding, pain, recurrence, reoperation, incontinence, heart attack, death, and other risks were discussed.  Educational handouts further explaining the pathology, treatment options, and bowel regimen were given. The patient expressed understanding & wishes to proceed. We will work to coordinate surgery for a mutually convenient time.  ANOSCOPY, DIAGNOSTIC (16109(46600) Pt Education - [facility] Abscess/Fistula (AT): discussed with patient and provided information.  ENCOUNTER FOR PREOPERATIVE EXAMINATION FOR GENERAL SURGICAL PROCEDURE (Z01.818)  Current Plans You are being scheduled for  surgery- Our schedulers will call you.  You should hear from our office's scheduling department within 5 working days about the location, date, and time of surgery. We try to make accommodations for patient's preferences in scheduling surgery, but sometimes the OR schedule or the surgeon's schedule prevents us from making those accommodations.  If you have not heard from our office [phone]) in 5 working days, call the office and ask for your surgeon's nurse.  If you have other questions about your diagnosis, plan, or surgery, call the office and ask for your surgeon's nurse.  Pt Education - [facility] Rectal Prep for Anorectal outpatient/office surgery: discussed with patient and provided information. Pt Education - [facility] Rectal Surgery HCI (Nysha Koplin): discussed with patient and provided information.  TOBACCO ABUSE (Z72.0) Impression: STOP SMOKING!  We talked to the patient about the dangers of smoking. We stressed that tobacco use dramatically increases the risk of peri-operative complications such as infection, tissue necrosis leaving to problems with incision/wound and organ healing, hernia, chronic pain, heart attack, stroke, DVT, pulmonary embolism, and death. We noted there are programs in our community to help stop smoking. Information was available.  Current Plans Pt Education - [facility] STOP SMOKING  Ardeth SportsmanSteven C. Kinley Dozier, MD, FACS, MASCRS Gastrointestinal and Minimally Invasive Surgery  [facility] [address] [place], [place] [id number] [phone] Fax [phone] Main/Paging  CONTACT INFORMATION: Weekday (9AM-5PM) concerns: Call [facility] main office at [phone] Weeknight (5PM-9AM) or Weekend/Holiday concerns: Check [email or web address] for General Surgery [facility] coverage (Please, do not use SecureChat as it is not reliable communication to operating surgeons for immediate patient care)

## 2019-12-21 ENCOUNTER — Encounter (HOSPITAL_BASED_OUTPATIENT_CLINIC_OR_DEPARTMENT_OTHER): Payer: Self-pay

## 2019-12-21 ENCOUNTER — Emergency Department (HOSPITAL_BASED_OUTPATIENT_CLINIC_OR_DEPARTMENT_OTHER)
Admission: EM | Admit: 2019-12-21 | Discharge: 2019-12-21 | Disposition: A | Discharge status: Home / Self Care | Payer: Medicaid Other | Attending: Emergency Medicine | Admitting: Emergency Medicine

## 2019-12-21 ENCOUNTER — Other Ambulatory Visit: Payer: Self-pay

## 2019-12-21 DIAGNOSIS — M5412 Radiculopathy, cervical region: Secondary | ICD-10-CM | POA: Insufficient documentation

## 2019-12-21 DIAGNOSIS — M542 Cervicalgia: Secondary | ICD-10-CM | POA: Diagnosis present

## 2019-12-21 DIAGNOSIS — R202 Paresthesia of skin: Secondary | ICD-10-CM | POA: Diagnosis not present

## 2019-12-21 DIAGNOSIS — R42 Dizziness and giddiness: Secondary | ICD-10-CM | POA: Diagnosis not present

## 2019-12-21 DIAGNOSIS — F17210 Nicotine dependence, cigarettes, uncomplicated: Secondary | ICD-10-CM | POA: Insufficient documentation

## 2019-12-21 DIAGNOSIS — I10 Essential (primary) hypertension: Secondary | ICD-10-CM | POA: Diagnosis not present

## 2019-12-21 LAB — BASIC METABOLIC PANEL
Anion gap: 11 (ref 5–15)
BUN: 21 mg/dL — ABNORMAL HIGH (ref 6–20)
CO2: 24 mmol/L (ref 22–32)
Calcium: 9.5 mg/dL (ref 8.9–10.3)
Chloride: 103 mmol/L (ref 98–111)
Creatinine, Ser: 0.69 mg/dL (ref 0.61–1.24)
GFR, Estimated: >60 mL/min (ref >60)
Glucose, Bld: 93 mg/dL (ref 70–99)
Potassium: 4.3 mmol/L (ref 3.5–5.1)
Sodium: 138 mmol/L (ref 135–145)

## 2019-12-21 LAB — CBC WITH DIFFERENTIAL/PLATELET
Abs Immature Granulocytes: 0.03 10*3/uL (ref 0.00–0.07)
Basophils Absolute: 0.1 10*3/uL (ref 0.0–0.1)
Basophils Relative: 1 %
Eosinophils Absolute: 0.4 10*3/uL (ref 0.0–0.5)
Eosinophils Relative: 3 %
HCT: 51 % (ref 39.0–52.0)
Hemoglobin: 16.8 g/dL (ref 13.0–17.0)
Immature Granulocytes: 0 %
Lymphocytes Relative: 36 %
Lymphs Abs: 3.7 10*3/uL (ref 0.7–4.0)
MCH: 27.2 pg (ref 26.0–34.0)
MCHC: 32.9 g/dL (ref 30.0–36.0)
MCV: 82.7 fL (ref 80.0–100.0)
Monocytes Absolute: 0.8 10*3/uL (ref 0.1–1.0)
Monocytes Relative: 8 %
Neutro Abs: 5.2 10*3/uL (ref 1.7–7.7)
Neutrophils Relative %: 52 %
Platelets: 415 10*3/uL — ABNORMAL HIGH (ref 150–400)
RBC: 6.17 MIL/uL — ABNORMAL HIGH (ref 4.22–5.81)
RDW: 15.7 % — ABNORMAL HIGH (ref 11.5–15.5)
WBC: 10.2 10*3/uL (ref 4.0–10.5)
nRBC: 0 % (ref 0.0–0.2)

## 2019-12-21 MED ORDER — PREDNISONE 10 MG (21) PO TBPK: ORAL_TABLET | Freq: Every day | ORAL | 0 refills | Status: DC

## 2019-12-21 MED ORDER — KETOROLAC TROMETHAMINE 30 MG/ML IJ SOLN
30.0000 mg | Freq: Once | INTRAMUSCULAR | Status: AC
  Administered 2019-12-21: 30 mg via INTRAVENOUS
  Filled 2019-12-21: qty 1
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
## 2019-12-21 NOTE — ED Notes (Signed)
Has strong left radial and brachial pulse, cap refill WNL. Able to extend finger w/o issue and able to perform "[place]iking Sig" of Left thumb w/o difficulty as well

## 2019-12-21 NOTE — ED Triage Notes (Signed)
Pt c/o dizziness yesterday-none today-c/o pain to posterior neck, left shoulder/scapula and down left arm x 1 month-denies injury-NAD-steady gait

## 2019-12-21 NOTE — Discharge Instructions (Signed)
You are seen today for cervical radiculopathy, please use the attached instructions.  When she to follow-up with a neurologist as we spoke about.  Continue to take ibuprofen, also take the steroids prescribed which may help.  If you have any new or worsening concerning symptoms please come back to the emergency department.  Please speak to your pharmacist today about any new medications or side effects to medications prescribed today.

## 2019-12-21 NOTE — ED Notes (Signed)
States he awoke yesterday morning, got up to stand felt like room was "spinning" states it was difficult to pick up his daughter, states also he feels he has a pinched nerve at left side of neck, left scapula area, states he has been feeling some numbness and tingling in his left arm

## 2019-12-21 NOTE — ED Notes (Signed)
ED Provider at bedside. 

## 2019-12-21 NOTE — ED Notes (Signed)
States he feels tingling sensation in LUE currently, tingling is more from elbow down, pain from left shoulder to left elbow

## 2020-01-20 ENCOUNTER — Encounter (HOSPITAL_BASED_OUTPATIENT_CLINIC_OR_DEPARTMENT_OTHER): Payer: Self-pay | Admitting: Surgery

## 2020-01-21 ENCOUNTER — Encounter (HOSPITAL_BASED_OUTPATIENT_CLINIC_OR_DEPARTMENT_OTHER): Payer: Self-pay | Admitting: Surgery

## 2020-01-21 ENCOUNTER — Other Ambulatory Visit: Payer: Self-pay

## 2020-01-21 NOTE — Progress Notes (Signed)
Spoke w/ via phone for pre-op interview--- PT Lab needs dos---- Istat (gent. Ordered)              Lab results------ no COVID test ------ 01-25-2020 @ 1030 Arrive at ------- 1230 NPO after MN NO Solid Food.  Clear liquids from MN until--- 1130 Medications to take morning of surgery ----- Pepcid, Prilosec Diabetic medication ----- n/a Patient Special Instructions ----- n/a Pre-Op special Istructions ----- n/a Patient verbalized understanding of instructions that were given at this phone interview. Patient denies shortness of breath, chest pain, fever, cough at this phone interview.

## 2020-01-25 ENCOUNTER — Other Ambulatory Visit (HOSPITAL_COMMUNITY)
Admission: RE | Admit: 2020-01-25 | Discharge: 2020-01-25 | Disposition: A | Discharge status: Home / Self Care | Payer: Medicaid Other | Source: Ambulatory Visit | Attending: Surgery | Admitting: Surgery

## 2020-01-25 DIAGNOSIS — Z01812 Encounter for preprocedural laboratory examination: Secondary | ICD-10-CM | POA: Diagnosis not present

## 2020-01-25 DIAGNOSIS — Z20822 Contact with and (suspected) exposure to covid-19: Secondary | ICD-10-CM | POA: Diagnosis not present

## 2020-01-25 LAB — SARS CORONAVIRUS 2 (TAT 6-24 HRS): SARS Coronavirus 2: NEGATIVE

## 2020-01-28 ENCOUNTER — Ambulatory Visit (HOSPITAL_BASED_OUTPATIENT_CLINIC_OR_DEPARTMENT_OTHER): Payer: Medicaid Other | Admitting: Anesthesiology

## 2020-01-28 ENCOUNTER — Ambulatory Visit (HOSPITAL_BASED_OUTPATIENT_CLINIC_OR_DEPARTMENT_OTHER)
Admission: RE | Admit: 2020-01-28 | Discharge: 2020-01-28 | Disposition: A | Discharge status: Home / Self Care | Payer: Medicaid Other | Attending: Surgery | Admitting: Surgery

## 2020-01-28 ENCOUNTER — Encounter (HOSPITAL_BASED_OUTPATIENT_CLINIC_OR_DEPARTMENT_OTHER): Payer: Self-pay | Admitting: Surgery

## 2020-01-28 ENCOUNTER — Other Ambulatory Visit: Payer: Self-pay

## 2020-01-28 ENCOUNTER — Encounter (HOSPITAL_BASED_OUTPATIENT_CLINIC_OR_DEPARTMENT_OTHER)
Admission: RE | Disposition: A | Discharge status: Home / Self Care | Payer: Self-pay | Source: Home / Self Care | Attending: Surgery

## 2020-01-28 DIAGNOSIS — K641 Second degree hemorrhoids: Secondary | ICD-10-CM | POA: Diagnosis present

## 2020-01-28 DIAGNOSIS — K605 Anorectal fistula: Secondary | ICD-10-CM | POA: Diagnosis present

## 2020-01-28 DIAGNOSIS — K603 Anal fistula: Secondary | ICD-10-CM

## 2020-01-28 HISTORY — DX: Personal history of other diseases of the digestive system: Z87.19

## 2020-01-28 HISTORY — DX: Other constipation: K59.09

## 2020-01-28 HISTORY — DX: Disease of anus and rectum, unspecified: K62.9

## 2020-01-28 HISTORY — PX: ANAL FISTULECTOMY: SHX1139

## 2020-01-28 HISTORY — DX: Personal history of other specified conditions: Z87.898

## 2020-01-28 HISTORY — DX: Radiculopathy, cervical region: M54.12

## 2020-01-28 HISTORY — PX: EVALUATION UNDER ANESTHESIA WITH HEMORRHOIDECTOMY: SHX5624

## 2020-01-28 HISTORY — DX: Unspecified right bundle-branch block: I45.10

## 2020-01-28 LAB — POCT I-STAT, CHEM 8
BUN: 14 mg/dL (ref 6–20)
Calcium, Ion: 1.27 mmol/L (ref 1.15–1.40)
Chloride: 106 mmol/L (ref 98–111)
Creatinine, Ser: 0.7 mg/dL (ref 0.61–1.24)
Glucose, Bld: 96 mg/dL (ref 70–99)
HCT: 50 % (ref 39.0–52.0)
Hemoglobin: 17 g/dL (ref 13.0–17.0)
Potassium: 4.3 mmol/L (ref 3.5–5.1)
Sodium: 140 mmol/L (ref 135–145)
TCO2: 21 mmol/L — ABNORMAL LOW (ref 22–32)

## 2020-01-28 SURGERY — EXAM UNDER ANESTHESIA WITH HEMORRHOIDECTOMY
Anesthesia: General | Site: Rectum

## 2020-01-28 MED ORDER — KETOROLAC TROMETHAMINE 30 MG/ML IJ SOLN: 30.0000 mg | Freq: Once | INTRAMUSCULAR | Status: DC | PRN

## 2020-01-28 MED ORDER — SUGAMMADEX SODIUM 200 MG/2ML IV SOLN
INTRAVENOUS | Status: DC | PRN
  Administered 2020-01-28 (×2): 200 mg via INTRAVENOUS

## 2020-01-28 MED ORDER — ONDANSETRON HCL 4 MG/2ML IJ SOLN
INTRAMUSCULAR | Status: AC
  Filled 2020-01-28: qty 2

## 2020-01-28 MED ORDER — ACETAMINOPHEN 500 MG PO TABS
ORAL_TABLET | ORAL | Status: AC
  Filled 2020-01-28: qty 2

## 2020-01-28 MED ORDER — GABAPENTIN 300 MG PO CAPS
300.0000 mg | ORAL_CAPSULE | ORAL | Status: AC
  Administered 2020-01-28: 13:00:00 300 mg via ORAL

## 2020-01-28 MED ORDER — GABAPENTIN 300 MG PO CAPS
ORAL_CAPSULE | ORAL | Status: AC
  Filled 2020-01-28: qty 1

## 2020-01-28 MED ORDER — OXYCODONE HCL 5 MG PO TABS
5.0000 mg | ORAL_TABLET | Freq: Once | ORAL | Status: AC | PRN
  Administered 2020-01-28: 5 mg via ORAL

## 2020-01-28 MED ORDER — HYDROMORPHONE HCL 1 MG/ML IJ SOLN
0.2500 mg | INTRAMUSCULAR | Status: DC | PRN
  Administered 2020-01-28 (×2): 0.25 mg via INTRAVENOUS

## 2020-01-28 MED ORDER — LIDOCAINE 2% (20 MG/ML) 5 ML SYRINGE
INTRAMUSCULAR | Status: DC | PRN
  Administered 2020-01-28: 60 mg via INTRAVENOUS

## 2020-01-28 MED ORDER — CHLORHEXIDINE GLUCONATE CLOTH 2 % EX PADS: 6.0000 | MEDICATED_PAD | Freq: Once | CUTANEOUS | Status: DC

## 2020-01-28 MED ORDER — PROPOFOL 10 MG/ML IV BOLUS
INTRAVENOUS | Status: DC | PRN
  Administered 2020-01-28: 200 mg via INTRAVENOUS
  Administered 2020-01-28: 50 mg via INTRAVENOUS

## 2020-01-28 MED ORDER — OXYCODONE HCL 5 MG PO TABS
ORAL_TABLET | ORAL | Status: AC
  Filled 2020-01-28: qty 1

## 2020-01-28 MED ORDER — CLINDAMYCIN PHOSPHATE 900 MG/50ML IV SOLN
900.0000 mg | INTRAVENOUS | Status: AC
  Administered 2020-01-28: 14:00:00 900 mg via INTRAVENOUS

## 2020-01-28 MED ORDER — LIDOCAINE HCL (PF) 2 % IJ SOLN
INTRAMUSCULAR | Status: AC
  Filled 2020-01-28: qty 5

## 2020-01-28 MED ORDER — ALBUTEROL SULFATE HFA 108 (90 BASE) MCG/ACT IN AERS
INHALATION_SPRAY | RESPIRATORY_TRACT | Status: AC
  Filled 2020-01-28: qty 6.7

## 2020-01-28 MED ORDER — MIDAZOLAM HCL 5 MG/5ML IJ SOLN
INTRAMUSCULAR | Status: DC | PRN
  Administered 2020-01-28: 2 mg via INTRAVENOUS

## 2020-01-28 MED ORDER — CELECOXIB 200 MG PO CAPS
200.0000 mg | ORAL_CAPSULE | ORAL | Status: AC
  Administered 2020-01-28: 13:00:00 200 mg via ORAL

## 2020-01-28 MED ORDER — BUPIVACAINE-EPINEPHRINE 0.25% -1:200000 IJ SOLN
INTRAMUSCULAR | Status: DC | PRN
  Administered 2020-01-28: 20 mL

## 2020-01-28 MED ORDER — PROMETHAZINE HCL 25 MG/ML IJ SOLN: 6.2500 mg | INTRAMUSCULAR | Status: DC | PRN

## 2020-01-28 MED ORDER — CELECOXIB 200 MG PO CAPS
ORAL_CAPSULE | ORAL | Status: AC
  Filled 2020-01-28: qty 1

## 2020-01-28 MED ORDER — GENTAMICIN SULFATE 40 MG/ML IJ SOLN
440.0000 mg | INTRAVENOUS | Status: AC
  Administered 2020-01-28: 14:00:00 440 mg via INTRAVENOUS
  Filled 2020-01-28: qty 11

## 2020-01-28 MED ORDER — DEXAMETHASONE SODIUM PHOSPHATE 10 MG/ML IJ SOLN
INTRAMUSCULAR | Status: AC
  Filled 2020-01-28: qty 1

## 2020-01-28 MED ORDER — PHENYLEPHRINE 40 MCG/ML (10ML) SYRINGE FOR IV PUSH (FOR BLOOD PRESSURE SUPPORT)
PREFILLED_SYRINGE | INTRAVENOUS | Status: AC
  Filled 2020-01-28: qty 10

## 2020-01-28 MED ORDER — FENTANYL CITRATE (PF) 250 MCG/5ML IJ SOLN
INTRAMUSCULAR | Status: AC
  Filled 2020-01-28: qty 5

## 2020-01-28 MED ORDER — MEPERIDINE HCL 25 MG/ML IJ SOLN: 6.2500 mg | INTRAMUSCULAR | Status: DC | PRN

## 2020-01-28 MED ORDER — OXYCODONE HCL 5 MG PO TABS: 5.0000 mg | ORAL_TABLET | Freq: Four times a day (QID) | ORAL | 0 refills | Status: AC | PRN

## 2020-01-28 MED ORDER — ROCURONIUM BROMIDE 10 MG/ML (PF) SYRINGE
PREFILLED_SYRINGE | INTRAVENOUS | Status: DC | PRN
  Administered 2020-01-28: 60 mg via INTRAVENOUS

## 2020-01-28 MED ORDER — OXYCODONE HCL 5 MG/5ML PO SOLN: 5.0000 mg | Freq: Once | ORAL | Status: AC | PRN

## 2020-01-28 MED ORDER — ACETAMINOPHEN 500 MG PO TABS
1000.0000 mg | ORAL_TABLET | ORAL | Status: AC
  Administered 2020-01-28: 13:00:00 1000 mg via ORAL

## 2020-01-28 MED ORDER — FENTANYL CITRATE (PF) 100 MCG/2ML IJ SOLN
INTRAMUSCULAR | Status: DC | PRN
  Administered 2020-01-28: 50 ug via INTRAVENOUS
  Administered 2020-01-28: 150 ug via INTRAVENOUS

## 2020-01-28 MED ORDER — DIBUCAINE 1 % EX OINT
TOPICAL_OINTMENT | CUTANEOUS | Status: DC | PRN
  Administered 2020-01-28: 1

## 2020-01-28 MED ORDER — HYDROMORPHONE HCL 1 MG/ML IJ SOLN
INTRAMUSCULAR | Status: AC
  Filled 2020-01-28: qty 1

## 2020-01-28 MED ORDER — MIDAZOLAM HCL 2 MG/2ML IJ SOLN
INTRAMUSCULAR | Status: AC
  Filled 2020-01-28: qty 2

## 2020-01-28 MED ORDER — LACTATED RINGERS IV SOLN: INTRAVENOUS | Status: DC

## 2020-01-28 MED ORDER — PROPOFOL 10 MG/ML IV BOLUS
INTRAVENOUS | Status: AC
  Filled 2020-01-28: qty 20

## 2020-01-28 MED ORDER — DEXAMETHASONE SODIUM PHOSPHATE 10 MG/ML IJ SOLN
INTRAMUSCULAR | Status: DC | PRN
  Administered 2020-01-28: 10 mg via INTRAVENOUS

## 2020-01-28 MED ORDER — ENSURE PRE-SURGERY PO LIQD: 296.0000 mL | Freq: Once | ORAL | Status: DC

## 2020-01-28 MED ORDER — BUPIVACAINE LIPOSOME 1.3 % IJ SUSP
INTRAMUSCULAR | Status: DC | PRN
  Administered 2020-01-28: 20 mL

## 2020-01-28 MED ORDER — DEXMEDETOMIDINE (PRECEDEX) IN NS 20 MCG/5ML (4 MCG/ML) IV SYRINGE
PREFILLED_SYRINGE | INTRAVENOUS | Status: AC
  Filled 2020-01-28: qty 5

## 2020-01-28 MED ORDER — DEXMEDETOMIDINE HCL 200 MCG/2ML IV SOLN
INTRAVENOUS | Status: DC | PRN
  Administered 2020-01-28: 8 ug via INTRAVENOUS
  Administered 2020-01-28: 12 ug via INTRAVENOUS

## 2020-01-28 MED ORDER — ROCURONIUM BROMIDE 10 MG/ML (PF) SYRINGE
PREFILLED_SYRINGE | INTRAVENOUS | Status: AC
  Filled 2020-01-28: qty 10

## 2020-01-28 MED ORDER — BUPIVACAINE LIPOSOME 1.3 % IJ SUSP: 20.0000 mL | Freq: Once | INTRAMUSCULAR | Status: DC

## 2020-01-28 MED ORDER — ONDANSETRON HCL 4 MG/2ML IJ SOLN
INTRAMUSCULAR | Status: DC | PRN
  Administered 2020-01-28: 4 mg via INTRAVENOUS

## 2020-01-28 MED ORDER — PHENYLEPHRINE 40 MCG/ML (10ML) SYRINGE FOR IV PUSH (FOR BLOOD PRESSURE SUPPORT)
PREFILLED_SYRINGE | INTRAVENOUS | Status: DC | PRN
  Administered 2020-01-28: 80 ug via INTRAVENOUS

## 2020-01-28 MED ORDER — CLINDAMYCIN PHOSPHATE 900 MG/50ML IV SOLN
INTRAVENOUS | Status: AC
  Filled 2020-01-28: qty 50

## 2020-01-28 SURGICAL SUPPLY — 66 items
APL SKNCLS STERI-STRIP NONHPOA (GAUZE/BANDAGES/DRESSINGS) ×1
BENZOIN TINCTURE PRP APPL 2/3 (GAUZE/BANDAGES/DRESSINGS) ×3 IMPLANT
BLADE CLIPPER SENSICLIP SURGIC (BLADE) IMPLANT
BLADE HEX COATED 2.75 (ELECTRODE) IMPLANT
BLADE SURG 10 STRL SS (BLADE) IMPLANT
BLADE SURG 15 STRL LF DISP TIS (BLADE) ×1 IMPLANT
BLADE SURG 15 STRL SS (BLADE) ×3
BRIEF STRETCH FOR OB PAD LRG (UNDERPADS AND DIAPERS) ×3 IMPLANT
CANISTER SUCT 1200ML W/VALVE (MISCELLANEOUS) IMPLANT
COVER BACK TABLE 60X90IN (DRAPES) ×3 IMPLANT
COVER MAYO STAND STRL (DRAPES) ×3 IMPLANT
COVER WAND RF STERILE (DRAPES) ×3 IMPLANT
DECANTER SPIKE VIAL GLASS SM (MISCELLANEOUS) IMPLANT
DRAPE HYSTEROSCOPY (MISCELLANEOUS) IMPLANT
DRAPE LAPAROTOMY 100X72 PEDS (DRAPES) ×3 IMPLANT
DRAPE SHEET LG 3/4 BI-LAMINATE (DRAPES) IMPLANT
DRSG PAD ABDOMINAL 8X10 ST (GAUZE/BANDAGES/DRESSINGS) ×3 IMPLANT
ELECT NEEDLE TIP 2.8 STRL (NEEDLE) IMPLANT
ELECT REM PT RETURN 9FT ADLT (ELECTROSURGICAL) ×3
ELECTRODE REM PT RTRN 9FT ADLT (ELECTROSURGICAL) ×1 IMPLANT
FILTER STRAW (MISCELLANEOUS) ×3 IMPLANT
GAUZE SPONGE 4X4 12PLY STRL (GAUZE/BANDAGES/DRESSINGS) IMPLANT
GAUZE SPONGE 4X4 12PLY STRL LF (GAUZE/BANDAGES/DRESSINGS) ×3 IMPLANT
GLOVE ECLIPSE 8.0 STRL XLNG CF (GLOVE) ×3 IMPLANT
GLOVE INDICATOR 8.0 STRL GRN (GLOVE) ×3 IMPLANT
GOWN STRL REUS W/TWL XL LVL3 (GOWN DISPOSABLE) ×3 IMPLANT
IV CATH 14GX2 1/4 (CATHETERS) IMPLANT
IV CATH PLACEMENT 20 GA (IV SOLUTION) IMPLANT
KIT SIGMOIDOSCOPE (SET/KITS/TRAYS/PACK) IMPLANT
KIT TURNOVER CYSTO (KITS) ×3 IMPLANT
LEGGING LITHOTOMY PAIR STRL (DRAPES) IMPLANT
LOOP VESSEL MAXI BLUE (MISCELLANEOUS) IMPLANT
NEEDLE HYPO 22GX1.5 SAFETY (NEEDLE) ×3 IMPLANT
NS IRRIG 500ML POUR BTL (IV SOLUTION) ×3 IMPLANT
PACK BASIN DAY SURGERY FS (CUSTOM PROCEDURE TRAY) ×3 IMPLANT
PAD PREP 24X48 CUFFED NSTRL (MISCELLANEOUS) IMPLANT
PENCIL SMOKE EVACUATOR (MISCELLANEOUS) ×3 IMPLANT
SCRUB TECHNI CARE 4 OZ NO DYE (MISCELLANEOUS) ×3 IMPLANT
SHEARS HARMONIC 9CM CVD (BLADE) IMPLANT
SURGILUBE 2OZ TUBE FLIPTOP (MISCELLANEOUS) ×3 IMPLANT
SUT CHROMIC 2 0 SH (SUTURE) ×3 IMPLANT
SUT CHROMIC 3 0 SH 27 (SUTURE) ×3 IMPLANT
SUT ETHIBOND 0 (SUTURE) IMPLANT
SUT MNCRL AB 4-0 PS2 18 (SUTURE) IMPLANT
SUT PROLENE 2 0 SH DA (SUTURE) IMPLANT
SUT VIC AB 2-0 SH 27 (SUTURE)
SUT VIC AB 2-0 SH 27XBRD (SUTURE) IMPLANT
SUT VIC AB 2-0 UR6 27 (SUTURE) ×9 IMPLANT
SUT VIC AB 3-0 SH 18 (SUTURE) IMPLANT
SUT VICRYL 0 UR6 27IN ABS (SUTURE) IMPLANT
SUT VICRYL AB 2 0 TIE (SUTURE) IMPLANT
SUT VICRYL AB 2 0 TIES (SUTURE)
SWAB COLLECTION DEVICE MRSA (MISCELLANEOUS) IMPLANT
SWAB CULTURE ESWAB REG 1ML (MISCELLANEOUS) IMPLANT
SYR 20ML LL LF (SYRINGE) ×3 IMPLANT
SYR 27GX1/2 1ML LL SAFETY (SYRINGE) IMPLANT
SYR BULB IRRIG 60ML STRL (SYRINGE) ×3 IMPLANT
SYR CONTROL 10ML LL (SYRINGE) IMPLANT
TAPE CLOTH 3X10 TAN LF (GAUZE/BANDAGES/DRESSINGS) ×3 IMPLANT
TOWEL OR 17X26 10 PK STRL BLUE (TOWEL DISPOSABLE) ×3 IMPLANT
TRAY DSU PREP LF (CUSTOM PROCEDURE TRAY) ×3 IMPLANT
TUBE CONNECTING 12'X1/4 (SUCTIONS) ×1
TUBE CONNECTING 12X1/4 (SUCTIONS) ×2 IMPLANT
UNDERPAD 30X36 HEAVY ABSORB (UNDERPADS AND DIAPERS) ×3 IMPLANT
WATER STERILE IRR 500ML POUR (IV SOLUTION) IMPLANT
YANKAUER SUCT BULB TIP NO VENT (SUCTIONS) ×3 IMPLANT

## 2020-01-28 NOTE — Discharge Instructions (Signed)
ANORECTAL SURGERY:  POST OPERATIVE INSTRUCTIONS  ######################################################################  EAT Start with a pureed / full liquid diet After 24 hours, gradually transition to a high fiber diet.    CONTROL PAIN Control pain so you can tolerate bowel movements,  walk, sleep, tolerate sneezing/coughing, and go up/down stairs.   HAVE A BOWEL MOVEMENT DAILY Keep your bowels regular to avoid problems.   Taking a fiber supplement every day to keep bowels soft.   Try a laxative to override constipation. Use an antidairrheal to slow down diarrhea.   Call if not better after 2 tries  WALK Walk an hour a day.  Control your pain to do that.   CALL IF YOU HAVE PROBLEMS/CONCERNS Call if you are still struggling despite following these instructions. Call if you have concerns not answered by these instructions  ######################################################################    1. Take your usually prescribed home medications unless otherwise directed.  2. DIET: Follow a light bland diet & liquids the first 24 hours after arrival home, such as soup, liquids, starches, etc.  Be sure to drink plenty of fluids.  Quickly advance to a usual solid diet within a few days.  Avoid fast food or heavy meals as your are more likely to get nauseated or have irregular bowels.  A low-fat, high-fiber diet for the rest of your life is ideal.  3. PAIN CONTROL: a. Pain is best controlled by a usual combination of three different methods TOGETHER: i. Ice/Heat ii. Over the counter pain medication iii. Prescription pain medication b. Expect swelling and discomfort in the anus/rectal area.  Warm water baths (30-60 minutes up to 6 times a day, especially after bowel meovements) will help. Use ice for the first few days to help decrease swelling and bruising, then switch to heat such as warm towels, sitz baths, warm baths, etc to help relax tight/sore spots and speed recovery.   Some people prefer to use ice alone, heat alone, alternating between ice & heat.  Experiment to what works for you.   c. It is helpful to take an over-the-counter pain medication continuously for the first few weeks.  Choose one of the following that works best for you: i. Naproxen (Aleve, etc)  Two 250m tabs twice a day ii. Ibuprofen (Advil, etc) Three 2072mtabs four times a day (every meal & bedtime) iii. Acetaminophen (Tylenol, etc) 500-65044mour times a day (every meal & bedtime) d. A  prescription for pain medication (such as oxycodone, hydrocodone, etc) should be given to you upon discharge.  Take your pain medication as prescribed.  i. If you are having problems/concerns with the prescription medicine (does not control pain, nausea, vomiting, rash, itching, etc), please call us [phone] see if we need to switch you to a different pain medicine that will work better for you and/or control your side effect better. ii. If you need a refill on your pain medication, please contact your pharmacy.  They will contact our office to request authorization. Prescriptions will not be filled after 5 pm or on week-ends.  If can take up to 48 hours for it to be filled & ready so avoid waiting until you are down to thel ast pill. e. A topical cream (Dibucaine) or a prescription for a cream (such as diltiazem 2% gel) may be given to you.  Many people find relief with topical creams.  Some people find it burns too much.  Experiment.  If it helps, use it.  If it burns, don't using  it.  Use a Sitz Bath 4-8 times a day for relief   [employer] A sitz bath is a warm water bath taken in the sitting position that covers only the hips and buttocks. It may be used for either healing or hygiene purposes. Sitz baths are also used to relieve pain, itching, or muscle spasms. The water may contain medicine. Moist heat will help you heal and relax.  HOME CARE INSTRUCTIONS  Take 3 to 4 sitz baths a day. 1. Fill the  bathtub half full with warm water. 2. Sit in the water and open the drain a little. 3. Turn on the warm water to keep the tub half full. Keep the water running constantly. 4. Soak in the water for 15 to 20 minutes. 5. After the sitz bath, pat the affected area dry first.   4. KEEP YOUR BOWELS REGULAR a. The goal is one soft bowel movement a day b. Avoid getting constipated.  Between the surgery and the pain medications, it is common to experience some constipation.  Increasing fluid intake and taking a fiber supplement (such as Metamucil, Citrucel, FiberCon, MiraLax, etc) 2-3 times a day regularly will usually help prevent this problem from occurring.  A mild laxative (prune juice, Milk of Magnesia, MiraLax, etc) should be taken according to package directions if there are no bowel movements after 48 hours. c. Watch out for diarrhea.  If you have many loose bowel movements, simplify your diet to bland foods & liquids for a few days.  Stop any stool softeners and decrease your fiber supplement.  Switching to mild anti-diarrheal medications (Kayopectate, Pepto Bismol) can help.  Can try an imodium/loperamide dose.  If this worsens or does not improve, please call us.  5. Wound Care  a. Remove your bandages with your first bowel movement, usually the day after surgery.  Let the gauze fall off with the first bowel movement or shower.   b. Wear an absorbent pad or soft cotton balls in your underwear as needed to catch any drainage and help keep the area  c. Keep the area clean and dry.  Bathe / shower every day.  Keep the area clean by showering / bathing over the incision / wound.   It is okay to soak an open wound to help wash it.  Consider using a squeeze bottle filled with warm water to gently wash the anal area.  Wet wipes or showers / gentle washing after bowel movements is often less traumatic than regular toilet paper. d. Dennis Bast will often notice bleeding with bowel movements.  This should slow down  by the end of the first week of surgery.  Sitting on an ice pack can help. e. Expect some drainage.  This should slow down by the end of the first week of surgery, but you will have occasional bleeding or drainage up to a few months after surgery.  Wear an absorbent pad or soft cotton gauze in your underwear until the drainage stops.  6. ACTIVITIES as tolerated:   a. You may resume regular (light) daily activities beginning the next day--such as daily self-care, walking, climbing stairs--gradually increasing activities as tolerated.  If you can walk 30 minutes without difficulty, it is safe to try more intense activity such as jogging, treadmill, bicycling, low-impact aerobics, swimming, etc. b. Save the most intensive and strenuous activity for last such as sit-ups, heavy lifting, contact sports, etc  Refrain from any heavy lifting or straining until you are off narcotics for  pain control.   c. DO NOT PUSH THROUGH PAIN.  Let pain be your guide: If it hurts to do something, don't do it.  Pain is your body warning you to avoid that activity for another week until the pain goes down. d. You may drive when you are no longer taking prescription pain medication, you can comfortably sit for long periods of time, and you can safely maneuver your car and apply brakes. e. Bonita Quin may have sexual intercourse when it is comfortable.  7. FOLLOW UP in our office a. Please call [facility] at [phone] to set up an appointment to see your surgeon in the office for a follow-up appointment approximately 2-3 weeks after your surgery. b. Make sure that you call for this appointment the day you arrive home to ensure a convenient appointment time.  8. IF YOU HAVE DISABILITY OR FAMILY LEAVE FORMS, BRING THEM TO THE OFFICE FOR PROCESSING.  DO NOT GIVE THEM TO YOUR DOCTOR.        WHEN TO CALL us [phone]: 1. Poor pain control 2. Reactions / problems with new medications (rash/itching, nausea, etc)  3. Fever over  101.5 F (38.5 C) 4. Inability to urinate 5. Nausea and/or vomiting 6. Worsening swelling or bruising 7. Continued bleeding from incision. 8. Increased pain, redness, or drainage from the incision  The clinic staff is available to answer your questions during regular business hours (8:30am-5pm).  Please don't hesitate to call and ask to speak to one of our nurses for clinical concerns.   A surgeon from [facility] is always on call at the hospitals   If you have a medical emergency, go to the nearest emergency room or call 911.    [facility] [address], [place], [place]  [address] ? MAIN: [phone] ? TOLL FREE: [phone] ? FAX [phone] [email or web address]   Information for Discharge Teaching: EXPAREL (bupivacaine liposome injectable suspension)   Your surgeon gave you EXPAREL(bupivacaine) in your surgical incision to help control your pain after surgery.   EXPAREL is a local anesthetic that provides pain relief by numbing the tissue around the surgical site.  EXPAREL is designed to release pain medication over time and can control pain for up to 72 hours.  Depending on how you respond to EXPAREL, you may require less pain medication during your recovery.  Possible side effects:  Temporary loss of sensation or ability to move in the area where bupivacaine was injected.  Nausea, vomiting, constipation  Rarely, numbness and tingling in your mouth or lips, lightheadedness, or anxiety may occur.  Call your doctor right away if you think you may be experiencing any of these sensations, or if you have other questions regarding possible side effects.  Follow all other discharge instructions given to you by your surgeon or nurse. Eat a healthy diet and drink plenty of water or other fluids.  If you return to the hospital for any reason within 96 hours following the administration of EXPAREL, please inform  your health care providers. Post Anesthesia Home Care Instructions  Activity: Get plenty of rest for the remainder of the day. A responsible adult should stay with you for 24 hours following the procedure.  For the next 24 hours, DO NOT: -Drive a car -Advertising copywriter -Drink alcoholic beverages -Take any medication unless instructed by your physician -Make any legal decisions or sign important papers.  Meals: Start with liquid foods such as gelatin or soup. Progress  to regular foods as tolerated. Avoid greasy, spicy, heavy foods. If nausea and/or vomiting occur, drink only clear liquids until the nausea and/or vomiting subsides. Call your physician if vomiting continues.  Special Instructions/Symptoms: Your throat may feel dry or sore from the anesthesia or the breathing tube placed in your throat during surgery. If this causes discomfort, gargle with warm salt water. The discomfort should disappear within 24 hours.  If you had a scopolamine patch placed behind your ear for the management of post- operative nausea and/or vomiting:  1. The medication in the patch is effective for 72 hours, after which it should be removed.  Wrap patch in a tissue and discard in the trash. Wash hands thoroughly with soap and water. 2. You may remove the patch earlier than 72 hours if you experience unpleasant side effects which may include dry mouth, dizziness or visual disturbances. 3. Avoid touching the patch. Wash your hands with soap and water after contact with the patch.

## 2020-01-28 NOTE — Transfer of Care (Signed)
Immediate Anesthesia Transfer of Care Note  Patient: Brian Gilbert  Procedure(s) Performed: EXAM UNDER ANESTHESIA WITH  HEMORRHOID LIGATION AND PEXY (N/A Rectum) REPAIR OF PERIRECTAL FISTULA, MARSIPULIZATION (N/A Rectum)  Patient Location: PACU  Anesthesia Type:General  Level of Consciousness: awake, alert , oriented and patient cooperative  Airway & Oxygen Therapy: Patient Spontanous Breathing and Patient connected to nasal cannula oxygen  Post-op Assessment:Stable,  Report given to RN  Post vital signs: Reviewed and stable  Last Vitals:  Vitals Value Taken Time  BP 120/82 01/28/20 1502  Temp    Pulse 76 01/28/20 1506  Resp 21 01/28/20 1506  SpO2 97 % 01/28/20 1506  Vitals shown include unvalidated device data.  Last Pain:  Vitals:   01/28/20 1311  TempSrc: Oral  PainSc: 2       Patients Stated Pain Goal: 5 (01/28/20 1311)  Complications: No complications documented.

## 2020-01-28 NOTE — Anesthesia Procedure Notes (Signed)
Procedure Name: Intubation Date/Time: 01/28/2020 2:07 PM Performed by: Rogers Blocker, CRNA Pre-anesthesia Checklist: Patient identified, Emergency Drugs available, Suction available and Patient being monitored Patient Re-evaluated:Patient Re-evaluated prior to induction Oxygen Delivery Method: Circle System Utilized Preoxygenation: Pre-oxygenation with 100% oxygen Induction Type: IV induction Ventilation: Mask ventilation without difficulty Laryngoscope Size: Mac and 4 Grade View: Grade I Tube type: Oral Tube size: 7.5 mm Number of attempts: 1 Airway Equipment and Method: Stylet and Bite block Placement Confirmation: ETT inserted through vocal cords under direct vision,  positive ETCO2 and breath sounds checked- equal and bilateral Secured at: 22 cm Tube secured with: Tape Dental Injury: Teeth and Oropharynx as per pre-operative assessment
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
## 2020-01-28 NOTE — Anesthesia Postprocedure Evaluation (Signed)
Anesthesia Post Note  Patient: Brian Gilbert  Procedure(s) Performed: EXAM UNDER ANESTHESIA WITH  HEMORRHOID LIGATION AND PEXY (N/A Rectum) REPAIR OF PERIRECTAL FISTULA, MARSUPIALIZATION (N/A Rectum)     Patient location during evaluation: PACU Anesthesia Type: General Level of consciousness: awake and alert, oriented and patient cooperative Pain management: pain level controlled Vital Signs Assessment: post-procedure vital signs reviewed and stable Respiratory status: spontaneous breathing, nonlabored ventilation and respiratory function stable Cardiovascular status: blood pressure returned to baseline and stable Postop Assessment: no apparent nausea or vomiting Anesthetic complications: no   No complications documented.  Last Vitals:  Vitals:   01/28/20 1600 01/28/20 1615  BP: 108/75 113/87  Pulse: (!) 58 (!) 58  Resp: 10 11  Temp:    SpO2: 98% 97%    Last Pain:  Vitals:   01/28/20 1615  TempSrc:   PainSc: 6                  Lannie Fields
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
## 2020-01-28 NOTE — H&P (Signed)
Brian Gilbert DOB: 02/16/1975  Patient Care Team: Loyal JacobsonKalish, Michael, MD as PCP - General (Family Medicine) Karie SodaGross, Aspynn Clover, MD as Consulting Physician (General Surgery) Marcine Matarahlstedt, Stephen, MD as Consulting Physician (Urology) Dorna LeitzBadreddine, Rami, MD as Referring Physician (Gastroenterology)  Marland Kitchen` ` The patient returns s/p LIFT repair of intersphincteric perirectal fistula 07/23/2019  Pathology: Fistula tract. No evidence of malignancy.    Patient with intersphincteric fistula status post lift repair in June. Seemed to recover and was healed by month out. Follow-up when necessary in July. Patient notes he had recurrent pain and swelling and drainage this month. Wished to be reevaluated. Notes drainage has calm down and he is feeling better. Thinks it was triggered after stopping MiraLAX and having some hard stools. He's back on the MiraLAX and it is softer. He has noted some intermittent drainage. Drainage has not gone away. He continues to smoke. No history of fall or trauma. No history of hemorrhoid issues.  No personal nor family history of GI/colon cancer, inflammatory bowel disease, irritable bowel syndrome, allergy such as Celiac Sprue, dietary/dairy problems, colitis, ulcers nor gastritis. No recent sick contacts/gastroenteritis. No travel outside the country. No changes in diet. No dysphagia to solids or liquids. No significant heartburn or reflux. No melena, hematemesis, coffee ground emesis. No evidence of prior gastric/peptic ulceration.  Ready for surgery . `    ##############################################    SURGICAL PATHOLOGY CASE: [id number] [facility]     Clinical History: Perirectal fistula (crm)     FINAL MICROSCOPIC DIAGNOSIS:  A. FISTULA TRACT: - Consistent with fistula tract - No evidence of malignancy    Taimane Stimmel DESCRIPTION:  Received in formalin are 3 irregular  pieces of yellow-pink to red soft indurated tissue, 1.1 x 0.8 x 0.5 cm, 1.6 x 1.4 x 1.2 cm, and 2.7 x 2.2 x 1.8 cm. All 3 pieces have tan-pink to lightly blue stained wrinkled to granular skin on one aspect. The portions of skin on the 2 larger pieces are umbilicated, and within the umbilicated areas have defects of 0.2 cm in diameter which opened into fistula-like tracts which are lined by pink-red to blue stained soft tissue and exit on the deep surfaces. Representative sections are submitted in 1 block.  SW 07/23/2019    Final Diagnosis performed by Holley BoucheNilesh Kashikar, MD. Electronically signed 07/24/2019 Technical and / or Professional components performed at [facility], [address] [place], [place] [address]. Immunohistochemistry Technical component (if applicable) was performed at [facility]. [address], [address], [place], [place] [address]. IMMUNOHISTOCHEMISTRY DISCLAIMER (if applicable): Some of these immunohistochemical stains may have been developed and the performance characteristics determine by [facility]. Some may not have been cleared or approved by the U.S. Food and Drug Administration. The FDA has determined that such clearance or approval is not necessary. This test is used for clinical purposes. It should not be regarded as investigational or for research. This laboratory is certified under the Clinical Laboratory Improvement Amendments of 1988 (CLIA-88) as qualified to perform high complexity clinical laboratory testing. The controls stained appropriately. ` ` `  07/23/2019  9:02 AM  PATIENT: Brian RocheMohammed Hustead 44 y.o. male  Patient Care Team: Loyal JacobsonKalish, Michael, MD as PCP - General (Family Medicine) Karie SodaGross, Takeisha Cianci, MD as Consulting Physician (General Surgery) Marcine Matarahlstedt, Stephen, MD as Consulting Physician (Urology)  PRE-OPERATIVE DIAGNOSIS: PERIRECTAL FISTULA  POST-OPERATIVE  DIAGNOSIS: INTERSPHINCTERIC PERIRECTAL FISTULA  PROCEDURE:  LIFT (ligation of intersphincteric tract) REPAIR OF PERIRECTAL FISTULA HEMORRHOIDAL LIGATION/PEXY ANORECTAL  EXAM UNDER ANESTHESIA  SURGEON: [name], MD  ASSISTANT: OR Staff  ANESTHESIA:  General Anorectal & Local field block (0.25% bupivacaine with epinephrine mixed with Liposomal bupivacaine (Experel)   EBL: Total I/O In: - Out: 20 [Blood:20]. See anesthesia record  Delay start of Pharmacological VTE agent (>24hrs) due to surgical blood loss or risk of bleeding: no  DRAINS: none  SPECIMEN: External component of intersphincteric fistula tract  DISPOSITION OF SPECIMEN: PATHOLOGY  COUNTS: YES  PLAN OF CARE: Discharge to home after PACU  PATIENT DISPOSITION: PACU - hemodynamically stable.  INDICATION: Patient with probable perirectal fistula. I recommended examination and surgical treatment:  The anatomy & physiology of the anorectal region was discussed. We discussed the pathophysiology of anorectal abscess and fistula. Differential diagnosis was discussed. Natural history progression was discussed. I stressed the importance of a bowel regimen to have daily soft bowel movements to minimize progression of disease.   The patient's condition is not adequately controlled. Non-operative treatment has not healed the fistula. Therefore, I recommended examination under anaesthesia to confirm the diagnosis and treat the fistula. I discussed techniques that may be required such as fistulotomy, ligation by LIFT technique, and/or seton placement. Benefits & alternatives discussed. I noted a good likelihood this will help address the problem, but sometimes repeat operations and prolonged healing times may occur. Risks such as bleeding, pain, recurrence, reoperation, incontinence, heart attack, death, and other risks were discussed.   Educational handouts further explaining the  pathology, treatment options, and bowel regimen were given. The patient expressed understanding & wishes to proceed. We will work to coordinate surgery for a mutually convenient time.  OR FINDINGS: Patient had an intersphincteric fistula.   External location LEFT LATERAL  about 3 cm from anal verge.  Internal location : Anterior midline at anal crypt about 1 cm from anal verge.  Grade 2 internal hemorrhoids. Left internal hemorrhoid ligation/pexy done  DESCRIPTION:  Informed consent was confirmed. Patient underwent general anesthesia without difficulty. Patient was placed into prone positioning. The perianal region was prepped and draped in sterile fashion. Surgical timeout confirmed or plan.  I did digital rectal examination and then transitioned over to anoscopy to get a sense of the anatomy. I did place a probe through the external opening but it would not pass more proximally from the chronic abscess cavity. I also injected the track with methylene blue. With this I was able to locate an internal opening near the anterior midline and an anal crypt. Slightly left anterior. The tract did not feel superficial, concerning for a probable intersphincteric fistula. I was able to use a shepherd's hook probe from the anal crypt and easily pass through the sphincter complex out into the left lateral ischio rectal fossa. No abscess located.  I went ahead and proceeded with the LIFT technique. I began to excise the external opening with a radial biconcave incision around it. I transitioned to cautery and help free the fistulous tract circumferentially all way down towards the sphincter component. I was able to come through the fistulous tract a little more proximally to more clean opening stained with methylene blue. With the track straighten out I was able to get a probe from the external and and connect with the internal anal crypt opening.  I made a transverse incision at the  anal squamocolumnar junction . Did careful dissection to get down to the sphincter complex. I carefully went between the internal and external sphincter using careful blunt dissection parallel to the fibers.  I was able to locate the intersphincteric component of the fistulous tract. I was able to get around it gently with a right angle clamp. I carefully skeletonized the intersphincteric component. I placed 2-0 Vicryl stitches through the intersphincteric tract on the proximal side and on the distal side in between the external & internal sphincters. I transected the intersphincteric segment of the fistulous tract. I ligated the stumps of the transected segments with 2-0 Vicryl again with a figure-of-eight stitch in a 90 degree fashion to doubly ligate and prove that the tract had been closed. I removed the superficial external end of the fistulous tract & ligated the base of the external wound just external to the sphincter component with 2-0 vicryl.  I then transitioned to the rectal component. Did a figure-of-eight stitch of 2-0 Vicryl suture 6 centimeters proximal to the internal opening in the rectum along a hemorrhoidal:. I ran that longitudinally until it came to the anal verge. Tied down with a running suture. That provided good ligation and pexy and also cover up to the internal opening of the anal verge. I closed the transverse arc anal verge wound where I done internal sphincteric tract ligation. I used 2-0 chromic interrupted horizontal mattress sutures to help close that transversely. Hemostasis was excellent.  I reexamined the anal canal. There is was no narrowing. Hemostasis was excellent. I repeated anoscopy and examination. Hemostasis was good. We placed fluff gauze to onlay over the wounds. No packing done. Patient was extubated. He woke up somewhat agitated and confused but was ultimately consolable and is sitting up alert and apologetic in the recovery room.  Breathing well. I called and discussed operative findings with the patient's father, Heron Nay, per his request. Questions answered. He expressed understanding and appreciation. [facility] so [facility] to [facility] pharmacy   [name], M.D., F.A.C.S. Gastrointestinal and Minimally Invasive Surgery [facility] 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) repair of perirectal fistula - 07/23/2019 - Lupie Sawa ENCOUNTER FOR PREOPERATIVE EXAMINATION FOR GENERAL SURGICAL PROCEDURE (Z01.818) TOBACCO ABUSE (Z72.0) F/U PRN - HISTORY OF RECTAL SURGERY (S28.315)  Diagnostic Studies History [name], MD; 12/08/2019 11:54 AM) Colonoscopy never  Allergies [name], MD; 12/08/2019 11:54 AM) PCN-200 *MISCELLANEOUS THERAPEUTIC CLASSES* Allergies Reconciled Bactrim *ANTI-INFECTIVE AGENTS - MISC.*  Medication History [name], CMA; 12/08/2019 11:50 AM) Medications Reconciled  Social History [name], MD; 12/08/2019 11:54 AM) Alcohol use Remotely quit alcohol use. Caffeine use Carbonated beverages, Coffee, Tea. Illicit drug use Remotely quit drug use. Tobacco use Current every day smoker. He is from [place]. He [place].  Other Problems [name], MD; 12/08/2019 11:54 AM) Anxiety Disorder High blood pressure Seizure Disorder    Vitals ([name] CMA; 12/08/2019 11:50 AM) 12/08/2019 11:50 AM Weight: 244 lb Height: 72in Body Surface Area: 2.32 m Body Mass Index: 33.09 kg/m  Pulse: 94 (Regular)  BP: 118/78(Sitting, Left Arm, Standard)   BP 120/82   Pulse 85   Temp 98.3 F (36.8 C) (Oral)   Ht 5\' 11"  (1.803 m)   Wt 103.2 kg   SpO2 99%    BMI 31.73 kg/m  01/28/2020      Physical Exam 01/30/2020 MD; 12/08/2019 12:07 PM)  General Mental Status-Alert. General Appearance-Not in acute distress. Voice-Normal.  Integumentary  Global Assessment Upon inspection and palpation of skin surfaces of the - Distribution of scalp and body hair is normal. General Characteristics Overall examination of the patient's skin reveals - no rashes and no suspicious lesions.  Head and Neck Head-normocephalic, atraumatic with no lesions or palpable masses. Face Global Assessment - atraumatic, no absence of expression. Neck Global Assessment - no abnormal movements, no decreased range of motion. Trachea-midline. Thyroid Gland Characteristics - non-tender.  Eye Eyeball - Left-Extraocular movements intact, No Nystagmus - Left. Eyeball - Right-Extraocular movements intact, No Nystagmus - Right. Upper Eyelid - Left-No Cyanotic - Left. Upper Eyelid - Right-No Cyanotic - Right.  Chest and Lung Exam Inspection Accessory muscles - No use of accessory muscles in breathing.  Abdomen Note: Obese but soft. No umbilical hernia. No pain or discomfort. Mild diastases recti.  Male Genitourinary Note: No inguinal hernias. Normal external genitalia. Epididymi, testes, and spermatic cords normal without any masses.  Rectal Note: Perianal skin was some moisture and maceration consistent with some chronic pruritus. Left anterior pinhole opening and some fold thickening going to left anterior aspect suspicious for recurrent perirectal fistula. Sensitive but tolerates digital and anoscopic exam. Some mild anal canal thickening. I suspect he has a small opening at the anal verge. Anterior midline.  Rather suspicious for superficial perirectal fistula recurrence.  No posterior midline fissure. No obvious proctitis. Grade 1-2 internal hemorrhoids.  Peripheral Vascular Upper Extremity Inspection - Left - Not  Gangrenous, No Petechiae. Inspection - Right - Not Gangrenous, No Petechiae.  Neurologic Neurologic evaluation reveals -normal attention span and ability to concentrate, able to name objects and repeat phrases. Appropriate fund of knowledge and normal coordination.  Neuropsychiatric Mental status exam performed with findings of-able to articulate well with normal speech/language, rate, volume and coherence and no evidence of hallucinations, delusions, obsessions or homicidal/suicidal ideation. Orientation-oriented X3.  Musculoskeletal Global Assessment Gait and Station - normal gait and station.  Lymphatic General Lymphatics Description - No Generalized lymphadenopathy.   Results [name] MD; 12/08/2019 1:16 PM) Procedures  Name Value Date Hemorrhoids Procedure Other: Perianal skin was some moisture and maceration consistent with some chronic pruritus. Left anterior pinhole opening and some fold thickening going to left anterior aspect suspicious for recurrent pararectal fistula. Sensitive but tolerates digital and anoscopic exam. Some mild anal canal thickening. I suspect he has a small opening at the anal verge. Anterior midline............Marland KitchenRather suspicious for superficial perirectal fistula recurrence............Marland KitchenNo posterior midline fissure. No obvious proctitis. Grade 1-2 internal hemorrhoids.  Performed: 12/08/2019 12:08 PM    Assessment & Plan [name] MD; 12/08/2019 12:18 PM)  Ocie Cornfield FISTULA (K60.4) Impression: Status post LIFTrepair of intersphincteric fistula in June - healed July 2021. Now with evidence of pain swelling and drainage a week ago. Examination suspicious for superficial recurrence. Other possibility is intersphincteric.  I think this will require examination under anesthesia. Possible fistulotomy versus internal sphincterotomy/seton placement depending upon what is found intraoperatively. I don't  think doing an MRI of the pelvis without much at this point since it seems apparent on physical exam he does not have a history of inflammatory bowel disease or other major GI/anorectal issues.  I strongly recommend he stay on some type of fiber bowel regimen such as MiraLAX everyday to keep his stools soft to avoid future triggering events.  I again strongly recommend he quit smoking as that affects poor wound healing and raises my concern that his risk of recurrence goes up.  Trying get a copy of his colonoscopy  report from May. Had a seven-year follow-up recommended, so I suspect it was just showing 1 polyp. Double check with his gastrologist, Dr. Octaviano Glow.  The anatomy & physiology of the anorectal region was discussed. We discussed the pathophysiology of anorectal abscess and fistula. Differential diagnosis was discussed. Natural history progression was discussed. I stressed the importance of a bowel regimen to have daily soft bowel movements to minimize progression of disease.  The patient's condition is not adequately controlled. Non-operative treatment has not healed the fistula. Therefore, I recommended examination under anaesthesia to confirm the diagnosis and treat the fistula. I discussed techniques that may be required such as fistulotomy, ligation by LIFT technique, and/or seton placement. Benefits & alternatives discussed. I noted a good likelihood this will help address the problem, but sometimes repeat operations and prolonged healing times may occur. Risks such as bleeding, pain, recurrence, reoperation, incontinence, heart attack, death, and other risks were discussed.  Educational handouts further explaining the pathology, treatment options, and bowel regimen were given. The patient expressed understanding & wishes to proceed. We will work to coordinate surgery for a mutually convenient time.  TOBACCO ABUSE (Z72.0) Impression: STOP SMOKING!  We talked  to the patient about the dangers of smoking. We stressed that tobacco use dramatically increases the risk of peri-operative complications such as infection, tissue necrosis leaving to problems with incision/wound and organ healing, hernia, chronic pain, heart attack, stroke, DVT, pulmonary embolism, and death. We noted there are programs in our community to help stop smoking. Information was available.  Current Plans Pt Education - [facility] STOP SMOKING  [name], MD, FACS, MASCRS Gastrointestinal and Minimally Invasive Surgery  [facility] [address] [place], [place] [address] [phone] Fax [phone] Main/Paging  CONTACT INFORMATION: Weekday (9AM-5PM) concerns: Call [facility] main office at [phone] Weeknight (5PM-9AM) or Weekend/Holiday concerns: Check [email or web address] for General Surgery [facility] coverage (Please, do not use SecureChat as it is not reliable communication to operating surgeons for immediate patient care)

## 2020-01-28 NOTE — Op Note (Signed)
01/28/2020  2:47 PM  PATIENT:  Brian Gilbert  44 y.o. male  Patient Care Team: Loyal Jacobson, MD as PCP - General (Family Medicine) Karie Soda, MD as Consulting Physician (General Surgery) Marcine Matar, MD as Consulting Physician (Urology) Dorna Leitz, MD as Referring Physician (Gastroenterology)  PRE-OPERATIVE DIAGNOSIS:  PERIRECTAL FISTULA  POST-OPERATIVE DIAGNOSIS:   SUPERFICIAL ANAL FISTULA GRADE 2 INTERNAL HEMORRHOIDS  PROCEDURE:   ANAL FISTULOTOMY WITH MARSUPIALIZATION HEMORRHOID LIGATION/PEXY X3  ANORECTAL EXAM UNDER ANESTHESIA   SURGEON:  [name], MD  ASSISTANT: OR Staff   ANESTHESIA:   General Anorectal & Local field block (0.25% bupivacaine with epinephrine mixed with Liposomal bupivacaine (Experel)    EBL:  Total I/O In: 961 [I.V.:800; IV Piggyback:161] Out: - .  See anesthesia record  Delay start of Pharmacological VTE agent (>24hrs) due to surgical blood loss or risk of bleeding:  no  DRAINS: none   SPECIMEN:  Source of Specimen:  Anal fistula tract  DISPOSITION OF SPECIMEN:  PATHOLOGY  COUNTS:  YES  PLAN OF CARE: Discharge to home after PACU  PATIENT DISPOSITION:  PACU - hemodynamically stable.  INDICATION: Patient with history of perirectal abscesses recurring and found to have intersphincteric fistula.  Lift repair done summer 2021.  Patient had recurrent drainage.  I suspected superficial recurrence. I recommended examination and surgical treatment:  The anatomy & physiology of the anorectal region was discussed.  We discussed the pathophysiology of anorectal abscess and fistula.  Differential diagnosis was discussed.  Natural history progression was discussed.   I stressed the importance of a bowel regimen to have daily soft bowel movements to minimize progression of disease.     The patient's condition is not adequately controlled.  Non-operative treatment has not healed the fistula.  Therefore, I recommended  examination under anaesthesia to confirm the diagnosis and treat the fistula.  I discussed techniques that may be required such as fistulotomy, ligation by LIFT technique, and/or seton placement.  Benefits & alternatives discussed.  I noted a good likelihood this will help address the problem, but sometimes repeat operations and prolonged healing times may occur.  Risks such as bleeding, pain, recurrence, reoperation, incontinence, heart attack, death, and other risks were discussed.      Educational handouts further explaining the pathology, treatment options, and bowel regimen were given.  The patient expressed understanding & wishes to proceed.  We will work to coordinate surgery for a mutually convenient time.   OR FINDINGS: Patient had a superficial anal fistula.    External location LEFT ANTERIOR   about 1 cm from anal verge.  Internal location : Anterior midline analverge about 0 cm from anal verge.  DESCRIPTION:   Informed consent was confirmed. Patient underwent general anesthesia without difficulty. Patient was placed into prone positioning.  The perianal region was prepped and draped in sterile fashion. Surgical timeout confirmed or plan.  I did digital rectal examination and then transitioned over to anoscopy to get a sense of the anatomy.  I did place a probe through the external opening.  I could easily track a short superficial anal fistula about 15 mm long going to the anterior midline anal verge.  It was superficial to the sphincters.  Therefore on removed it using cautery through the superficial skin of the fistulous tract.  I excised the anterior and lateral walls of the tract to have a open flat wound going from anterior midline to left anterolateral.  I used 3-0 chromic to suture around the edges to  help marsupialize it and prevent recannulation.  The examination.  Right anterior grade 2 enlarged hemorrhoid noted.  I ligated using 2-0 Vicryl suture on a UR 6 in a running  interrupted fashion to provide ligation and pexy.  I did similar suturing on left lateral and left posterior hemorrhoidal columns in a similar fashion as well.  No major problems right lateral right posterior side, so I left those alone.    I reexamined the anal canal.   There is was no narrowing.  Hemostasis was excellent.  I repeated anoscopy and examination.  Hemostasis was good.  We placed fluff gauze to onlay over the wounds.  No packing done.  Patient is being extubated go to recovery room.  I discussed postoperative care with the patient.  He is familiar with the prior anal fistula surgery.  [facility] on the first anal surgery, so he switched to a different pharmacy.  I encouraged to call us if he needs a refill and give [place] a few days notice so that pharmacy does not cause any future problems again.  He expressed appreciation.   I discussed operative findings, updated the patient's status, discussed probable steps to recovery, and gave postoperative recommendations to the patient's father, Anette Guarneri.  Recommendations were made.  Questions were answered.  He expressed understanding & appreciation.   [name], M.D., F.A.C.S. Gastrointestinal and Minimally Invasive Surgery [facility] [address] [place], [place] [address] [phone] Main / Paging

## 2020-01-29 ENCOUNTER — Encounter (HOSPITAL_BASED_OUTPATIENT_CLINIC_OR_DEPARTMENT_OTHER): Payer: Self-pay | Admitting: Surgery

## 2020-01-29 LAB — SURGICAL PATHOLOGY

## 2020-03-16 ENCOUNTER — Ambulatory Visit: Payer: Medicaid Other | Admitting: Diagnostic Neuroimaging

## 2020-03-16 ENCOUNTER — Encounter: Payer: Self-pay | Admitting: Diagnostic Neuroimaging

## 2020-03-16 VITALS — BP 124/78 | HR 80 | Ht 71.0 in | Wt 225.8 lb

## 2020-03-16 DIAGNOSIS — M79602 Pain in left arm: Secondary | ICD-10-CM | POA: Diagnosis not present

## 2020-03-16 DIAGNOSIS — R200 Anesthesia of skin: Secondary | ICD-10-CM

## 2020-03-16 DIAGNOSIS — M542 Cervicalgia: Secondary | ICD-10-CM | POA: Diagnosis not present
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
## 2020-03-16 NOTE — Patient Instructions (Signed)
LEFT NECK / SCAPULA / ARM PAIN (and numbness) - suspect musculoskeletal strain vs rotator cuff strain vs myofascial pain; cervical radiculopathy or peripheral neuropathy also possible - check EMG/NCS (electrical nerve testing); may consider MRI cervical spine in future - referral to PT / OT - use ibuprofen / tylenol as needed - may consider sports medicine evaluation in future

## 2020-03-23 ENCOUNTER — Other Ambulatory Visit: Payer: Self-pay

## 2020-03-23 ENCOUNTER — Ambulatory Visit: Payer: Medicaid Other | Attending: Diagnostic Neuroimaging | Admitting: Occupational Therapy

## 2020-03-23 ENCOUNTER — Encounter: Payer: Self-pay | Admitting: Physical Therapy

## 2020-03-23 ENCOUNTER — Ambulatory Visit: Payer: Medicaid Other | Admitting: Physical Therapy

## 2020-03-23 ENCOUNTER — Encounter: Payer: Self-pay | Admitting: Occupational Therapy

## 2020-03-23 DIAGNOSIS — M542 Cervicalgia: Secondary | ICD-10-CM | POA: Insufficient documentation

## 2020-03-23 DIAGNOSIS — M79622 Pain in left upper arm: Secondary | ICD-10-CM

## 2020-03-23 DIAGNOSIS — R200 Anesthesia of skin: Secondary | ICD-10-CM | POA: Insufficient documentation

## 2020-03-23 DIAGNOSIS — M5412 Radiculopathy, cervical region: Secondary | ICD-10-CM | POA: Insufficient documentation

## 2020-03-23 DIAGNOSIS — R202 Paresthesia of skin: Secondary | ICD-10-CM | POA: Insufficient documentation

## 2020-03-23 DIAGNOSIS — M79602 Pain in left arm: Secondary | ICD-10-CM | POA: Insufficient documentation
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
## 2020-03-23 NOTE — Patient Instructions (Signed)
Access Code: [email or web address] URL: [email or web address] Date: 03/23/2020 Prepared by: Lysle Rubens  Exercises Seated Passive Cervical Retraction - 1 x daily - 7 x weekly - 3 sets - 10 reps - 3 sec hold Seated Assisted Cervical Rotation with Towel - 1 x daily - 7 x weekly - 3 sets - 5 reps - 5 sec hold Seated Upper Trapezius Stretch - 1 x daily - 7 x weekly - 3 sets - 2 reps - 20-30 sec hold Seated Levator Scapulae Stretch - 1 x daily - 7 x weekly - 3 sets - 2 reps - 20-30 sec hold Seated Scapular Retraction - 1 x daily - 7 x weekly - 3 sets - 10 reps

## 2020-03-24 ENCOUNTER — Ambulatory Visit (INDEPENDENT_AMBULATORY_CARE_PROVIDER_SITE_OTHER): Payer: Medicaid Other | Admitting: Diagnostic Neuroimaging

## 2020-03-24 ENCOUNTER — Encounter: Payer: Medicaid Other | Admitting: Diagnostic Neuroimaging

## 2020-03-24 DIAGNOSIS — M542 Cervicalgia: Secondary | ICD-10-CM

## 2020-03-24 DIAGNOSIS — R200 Anesthesia of skin: Secondary | ICD-10-CM

## 2020-03-24 DIAGNOSIS — Z0289 Encounter for other administrative examinations: Secondary | ICD-10-CM

## 2020-03-24 DIAGNOSIS — M79602 Pain in left arm: Secondary | ICD-10-CM | POA: Diagnosis not present

## 2020-03-24 NOTE — Procedures (Signed)
   [facility]  NCS (NERVE CONDUCTION STUDY) WITH EMG (ELECTROMYOGRAPHY) REPORT   STUDY DATE: 03/24/20 PATIENT NAME: Brian Gilbert DOB: [birth year] MRN: [id number]  ORDERING CLINICIAN: Joycelyn Schmid, MD   TECHNOLOGIST: Durenda Age ELECTROMYOGRAPHER: Glenford Bayley. Brynlee Pennywell, MD  CLINICAL INFORMATION: 45 year old male left arm pain and numbness.  FINDINGS: NERVE CONDUCTION STUDY:  Left median and left ulnar motor responses are normal.  Left median and left ulnar sensory responses are normal.  Left ulnar F wave latency is normal.   NEEDLE ELECTROMYOGRAPHY:  Needle examination of left upper extremity and left cervical paraspinal muscles normal.    IMPRESSION:   Normal study.  No electrodiagnostic evidence of left cervical radiculopathy or peripheral neuropathy at this time.     INTERPRETING PHYSICIAN:  Suanne Marker, MD Certified in Neurology, Neurophysiology and Neuroimaging  [facility] [address], [address] [place], [place] [address] [phone]   [facility]    Nerve / Sites Muscle Latency Ref. Amplitude Ref. Rel Amp Segments Distance Velocity Ref. Area    ms ms mV mV %  cm m/s m/s mVms  L Median - APB     Wrist APB 3.7 ?4.4 14.3 ?4.0 100 Wrist - APB 7   46.6     Upper arm APB 7.5  13.4  93.9 Upper arm - Wrist 23 60 ?49 46.1  L Ulnar - ADM     Wrist ADM 2.6 ?3.3 8.1 ?6.0 100 Wrist - ADM 7   31.9     B.Elbow ADM 6.2  7.6  93.3 B.Elbow - Wrist 22 61 ?49 29.4     A.Elbow ADM 7.9  9.8  129 A.Elbow - B.Elbow 10 59 ?49 38.6         SNC    Nerve / Sites Rec. Site Peak Lat Ref.  Amp Ref. Segments Distance Peak Diff Ref.    ms ms V V  cm ms ms  L Median, Ulnar - Transcarpal comparison     Median Palm Wrist 2.3 ?2.2 74 ?35 Median Palm - Wrist 8       Ulnar Palm Wrist 2.0 ?2.2 16 ?12 Ulnar Palm - Wrist 8          Median Palm - Ulnar Palm  0.3 ?0.4  L Median - Orthodromic (Dig II, Mid palm)     Dig II Wrist 3.3 ?3.4 13 ?10  Dig II - Wrist 13    L Ulnar - Orthodromic, (Dig V, Mid palm)     Dig V Wrist 2.4 ?3.1 6 ?5 Dig V - Wrist 51             F  Wave    Nerve F Lat Ref.   ms ms  L Ulnar - ADM 28.5 ?32.0       EMG Summary Table    Spontaneous MUAP Recruitment  Muscle IA Fib PSW Fasc Other Amp Dur. Poly Pattern  L. Deltoid Normal None None None _______ Normal Normal Normal Normal  L. Biceps brachii Normal None None None _______ Normal Normal Normal Normal  L. Triceps brachii Normal None None None _______ Normal Normal Normal Normal  L. Flexor carpi radialis Normal None None None _______ Normal Normal Normal Normal  L. First dorsal interosseous Normal None None None _______ Normal Normal Normal Normal  L. Cervical paraspinals Normal None None None _______ Normal Normal Normal Normal  L. Supraspinatus Normal None None None _______ Normal Normal Normal Normal

## 2020-03-28 ENCOUNTER — Encounter: Payer: Self-pay | Admitting: Physical Therapy

## 2020-03-28 ENCOUNTER — Ambulatory Visit: Payer: Medicaid Other | Admitting: Physical Therapy

## 2020-03-28 ENCOUNTER — Other Ambulatory Visit: Payer: Self-pay

## 2020-03-28 DIAGNOSIS — R200 Anesthesia of skin: Secondary | ICD-10-CM

## 2020-03-28 DIAGNOSIS — M542 Cervicalgia: Secondary | ICD-10-CM

## 2020-03-28 DIAGNOSIS — M79622 Pain in left upper arm: Secondary | ICD-10-CM

## 2020-03-28 DIAGNOSIS — M79602 Pain in left arm: Secondary | ICD-10-CM

## 2020-03-28 DIAGNOSIS — M5412 Radiculopathy, cervical region: Secondary | ICD-10-CM | POA: Diagnosis not present
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
## 2020-03-28 NOTE — Patient Instructions (Signed)

## 2020-03-29 NOTE — Addendum Note (Signed)
Addended by: Rosie Fate on: 03/29/2020 04:09 PM   Modules accepted: Orders

## 2020-04-01 ENCOUNTER — Other Ambulatory Visit: Payer: Self-pay

## 2020-04-01 ENCOUNTER — Ambulatory Visit: Payer: Medicaid Other | Admitting: Physical Therapy

## 2020-04-01 DIAGNOSIS — M5412 Radiculopathy, cervical region: Secondary | ICD-10-CM | POA: Diagnosis not present

## 2020-04-01 DIAGNOSIS — M79622 Pain in left upper arm: Secondary | ICD-10-CM
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
## 2020-04-06 ENCOUNTER — Other Ambulatory Visit: Payer: Self-pay

## 2020-04-06 ENCOUNTER — Ambulatory Visit: Payer: Medicaid Other | Admitting: Physical Therapy

## 2020-04-06 ENCOUNTER — Encounter: Payer: Self-pay | Admitting: Physical Therapy

## 2020-04-06 DIAGNOSIS — R200 Anesthesia of skin: Secondary | ICD-10-CM

## 2020-04-06 DIAGNOSIS — M79622 Pain in left upper arm: Secondary | ICD-10-CM

## 2020-04-06 DIAGNOSIS — M5412 Radiculopathy, cervical region: Secondary | ICD-10-CM
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
## 2020-04-11 ENCOUNTER — Telehealth: Payer: Self-pay | Admitting: Diagnostic Neuroimaging

## 2020-04-11 NOTE — Telephone Encounter (Signed)
ERROR

## 2020-04-13 ENCOUNTER — Other Ambulatory Visit: Payer: Self-pay

## 2020-04-13 ENCOUNTER — Encounter: Payer: Self-pay | Admitting: Physical Therapy

## 2020-04-13 ENCOUNTER — Ambulatory Visit: Payer: Medicaid Other | Attending: Diagnostic Neuroimaging | Admitting: Physical Therapy

## 2020-04-13 DIAGNOSIS — M542 Cervicalgia: Secondary | ICD-10-CM | POA: Insufficient documentation

## 2020-04-13 DIAGNOSIS — R200 Anesthesia of skin: Secondary | ICD-10-CM | POA: Diagnosis present

## 2020-04-13 DIAGNOSIS — R202 Paresthesia of skin: Secondary | ICD-10-CM | POA: Insufficient documentation

## 2020-04-13 DIAGNOSIS — M79602 Pain in left arm: Secondary | ICD-10-CM | POA: Diagnosis present

## 2020-04-13 DIAGNOSIS — M79622 Pain in left upper arm: Secondary | ICD-10-CM | POA: Insufficient documentation

## 2020-04-13 DIAGNOSIS — M5412 Radiculopathy, cervical region: Secondary | ICD-10-CM | POA: Insufficient documentation
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
## 2020-04-19 ENCOUNTER — Ambulatory Visit: Payer: Medicaid Other | Admitting: Physical Therapy
